# Patient Record
Sex: Male | Born: 1970
Health system: Southern US, Community
[De-identification: ages and names within clinical notes are randomized; demographics above are authoritative.]

## PROBLEM LIST (undated history)

## (undated) DIAGNOSIS — T7840XA Allergy, unspecified, initial encounter: Secondary | ICD-10-CM

## (undated) DIAGNOSIS — C7A8 Other malignant neuroendocrine tumors: Secondary | ICD-10-CM

## (undated) DIAGNOSIS — R0989 Other specified symptoms and signs involving the circulatory and respiratory systems: Secondary | ICD-10-CM

## (undated) DIAGNOSIS — K5520 Angiodysplasia of colon without hemorrhage: Secondary | ICD-10-CM

## (undated) DIAGNOSIS — G4730 Sleep apnea, unspecified: Secondary | ICD-10-CM

## (undated) HISTORY — DX: Sleep apnea, unspecified: G47.30

## (undated) HISTORY — DX: Angiodysplasia of colon without hemorrhage: K55.20

## (undated) HISTORY — DX: Allergy, unspecified, initial encounter: T78.40XA

## (undated) HISTORY — PX: CHOLECYSTECTOMY: SHX55

## (undated) HISTORY — PX: LIVER SURGERY: SHX698

## (undated) HISTORY — PX: WISDOM TOOTH EXTRACTION: SHX21

## (undated) HISTORY — PX: SPLENECTOMY: SUR1306

## (undated) HISTORY — PX: UPPER GASTROINTESTINAL ENDOSCOPY: SHX188

## (undated) HISTORY — PX: PANCREATECTOMY: SHX1019

## (undated) HISTORY — DX: Other specified symptoms and signs involving the circulatory and respiratory systems: R09.89

## (undated) HISTORY — DX: Other malignant neuroendocrine tumors: C7A.8

---

## 2007-03-19 ENCOUNTER — Emergency Department (HOSPITAL_COMMUNITY): Admission: EM | Admit: 2007-03-19 | Discharge: 2007-03-19 | Payer: Self-pay | Admitting: Emergency Medicine

## 2007-11-24 ENCOUNTER — Ambulatory Visit: Payer: Self-pay

## 2007-11-24 ENCOUNTER — Encounter (INDEPENDENT_AMBULATORY_CARE_PROVIDER_SITE_OTHER): Payer: Self-pay | Admitting: Family Medicine

## 2008-02-15 ENCOUNTER — Encounter: Payer: Self-pay | Admitting: Internal Medicine

## 2008-02-18 ENCOUNTER — Encounter: Payer: Self-pay | Admitting: Internal Medicine

## 2008-02-19 ENCOUNTER — Encounter: Payer: Self-pay | Admitting: Internal Medicine

## 2008-02-25 ENCOUNTER — Telehealth: Payer: Self-pay | Admitting: Internal Medicine

## 2008-02-25 ENCOUNTER — Encounter: Payer: Self-pay | Admitting: Internal Medicine

## 2008-02-26 ENCOUNTER — Encounter: Payer: Self-pay | Admitting: Internal Medicine

## 2008-03-02 ENCOUNTER — Ambulatory Visit: Payer: Self-pay | Admitting: Internal Medicine

## 2008-03-04 ENCOUNTER — Ambulatory Visit: Payer: Self-pay | Admitting: Cardiology

## 2008-03-04 ENCOUNTER — Telehealth (INDEPENDENT_AMBULATORY_CARE_PROVIDER_SITE_OTHER): Payer: Self-pay

## 2008-04-01 ENCOUNTER — Telehealth: Payer: Self-pay | Admitting: Internal Medicine

## 2008-04-11 ENCOUNTER — Ambulatory Visit: Payer: Self-pay | Admitting: Internal Medicine

## 2008-04-11 ENCOUNTER — Telehealth: Payer: Self-pay | Admitting: Internal Medicine

## 2008-04-12 ENCOUNTER — Ambulatory Visit: Payer: Self-pay | Admitting: Internal Medicine

## 2008-04-12 LAB — CONVERTED CEMR LAB
ALT: 31 units/L (ref 0–53)
Alkaline Phosphatase: 72 units/L (ref 39–117)
Bilirubin, Direct: 0.1 mg/dL (ref 0.0–0.3)
Eosinophils Relative: 0.2 % (ref 0.0–5.0)
HCT: 45 % (ref 39.0–52.0)
Lipase: 41 units/L (ref 11.0–59.0)
Lymphocytes Relative: 9.9 % — ABNORMAL LOW (ref 12.0–46.0)
Monocytes Absolute: 0.5 10*3/uL (ref 0.1–1.0)
Monocytes Relative: 8 % (ref 3.0–12.0)
Neutrophils Relative %: 81.7 % — ABNORMAL HIGH (ref 43.0–77.0)
Platelets: 106 10*3/uL — ABNORMAL LOW (ref 150–400)
RDW: 12.7 % (ref 11.5–14.6)
Total Bilirubin: 0.8 mg/dL (ref 0.3–1.2)
Total Protein: 7.3 g/dL (ref 6.0–8.3)
WBC: 6.6 10*3/uL (ref 4.5–10.5)

## 2008-06-09 ENCOUNTER — Ambulatory Visit: Payer: Self-pay | Admitting: Internal Medicine

## 2008-07-01 ENCOUNTER — Ambulatory Visit: Payer: Self-pay | Admitting: Internal Medicine

## 2008-07-05 LAB — CONVERTED CEMR LAB
Basophils Relative: 1 % (ref 0.0–3.0)
Eosinophils Absolute: 0.1 10*3/uL (ref 0.0–0.7)
Eosinophils Relative: 1.3 % (ref 0.0–5.0)
Hemoglobin: 15.6 g/dL (ref 13.0–17.0)
Lymphocytes Relative: 33.7 % (ref 12.0–46.0)
MCHC: 33.9 g/dL (ref 30.0–36.0)
MCV: 89.4 fL (ref 78.0–100.0)
Neutro Abs: 2.7 10*3/uL (ref 1.4–7.7)
Neutrophils Relative %: 54.8 % (ref 43.0–77.0)
RBC: 5.14 M/uL (ref 4.22–5.81)
WBC: 5 10*3/uL (ref 4.5–10.5)

## 2008-12-15 IMAGING — CT CT PELVIS W/ CM
1 series · 1 of 1 positions shown · IV contrast (agent unspecified)
Comparison: The outside ultrasound 02/19/2008

CT ABDOMEN

CLINICAL DATA: Epigastric pain, increased lipase

CT ABDOMEN AND PELVIS WITH CONTRAST
TECHNIQUE: Multidetector CT imaging of the abdomen and pelvis was
performed using the standard protocol following bolus
administration of intravenous contrast.
Contrast: 100 ml  Omni 300

[Series 1: — · 0.17mm/px · 1 of 1 slices shown]
[im 1/1  soft-tissue]
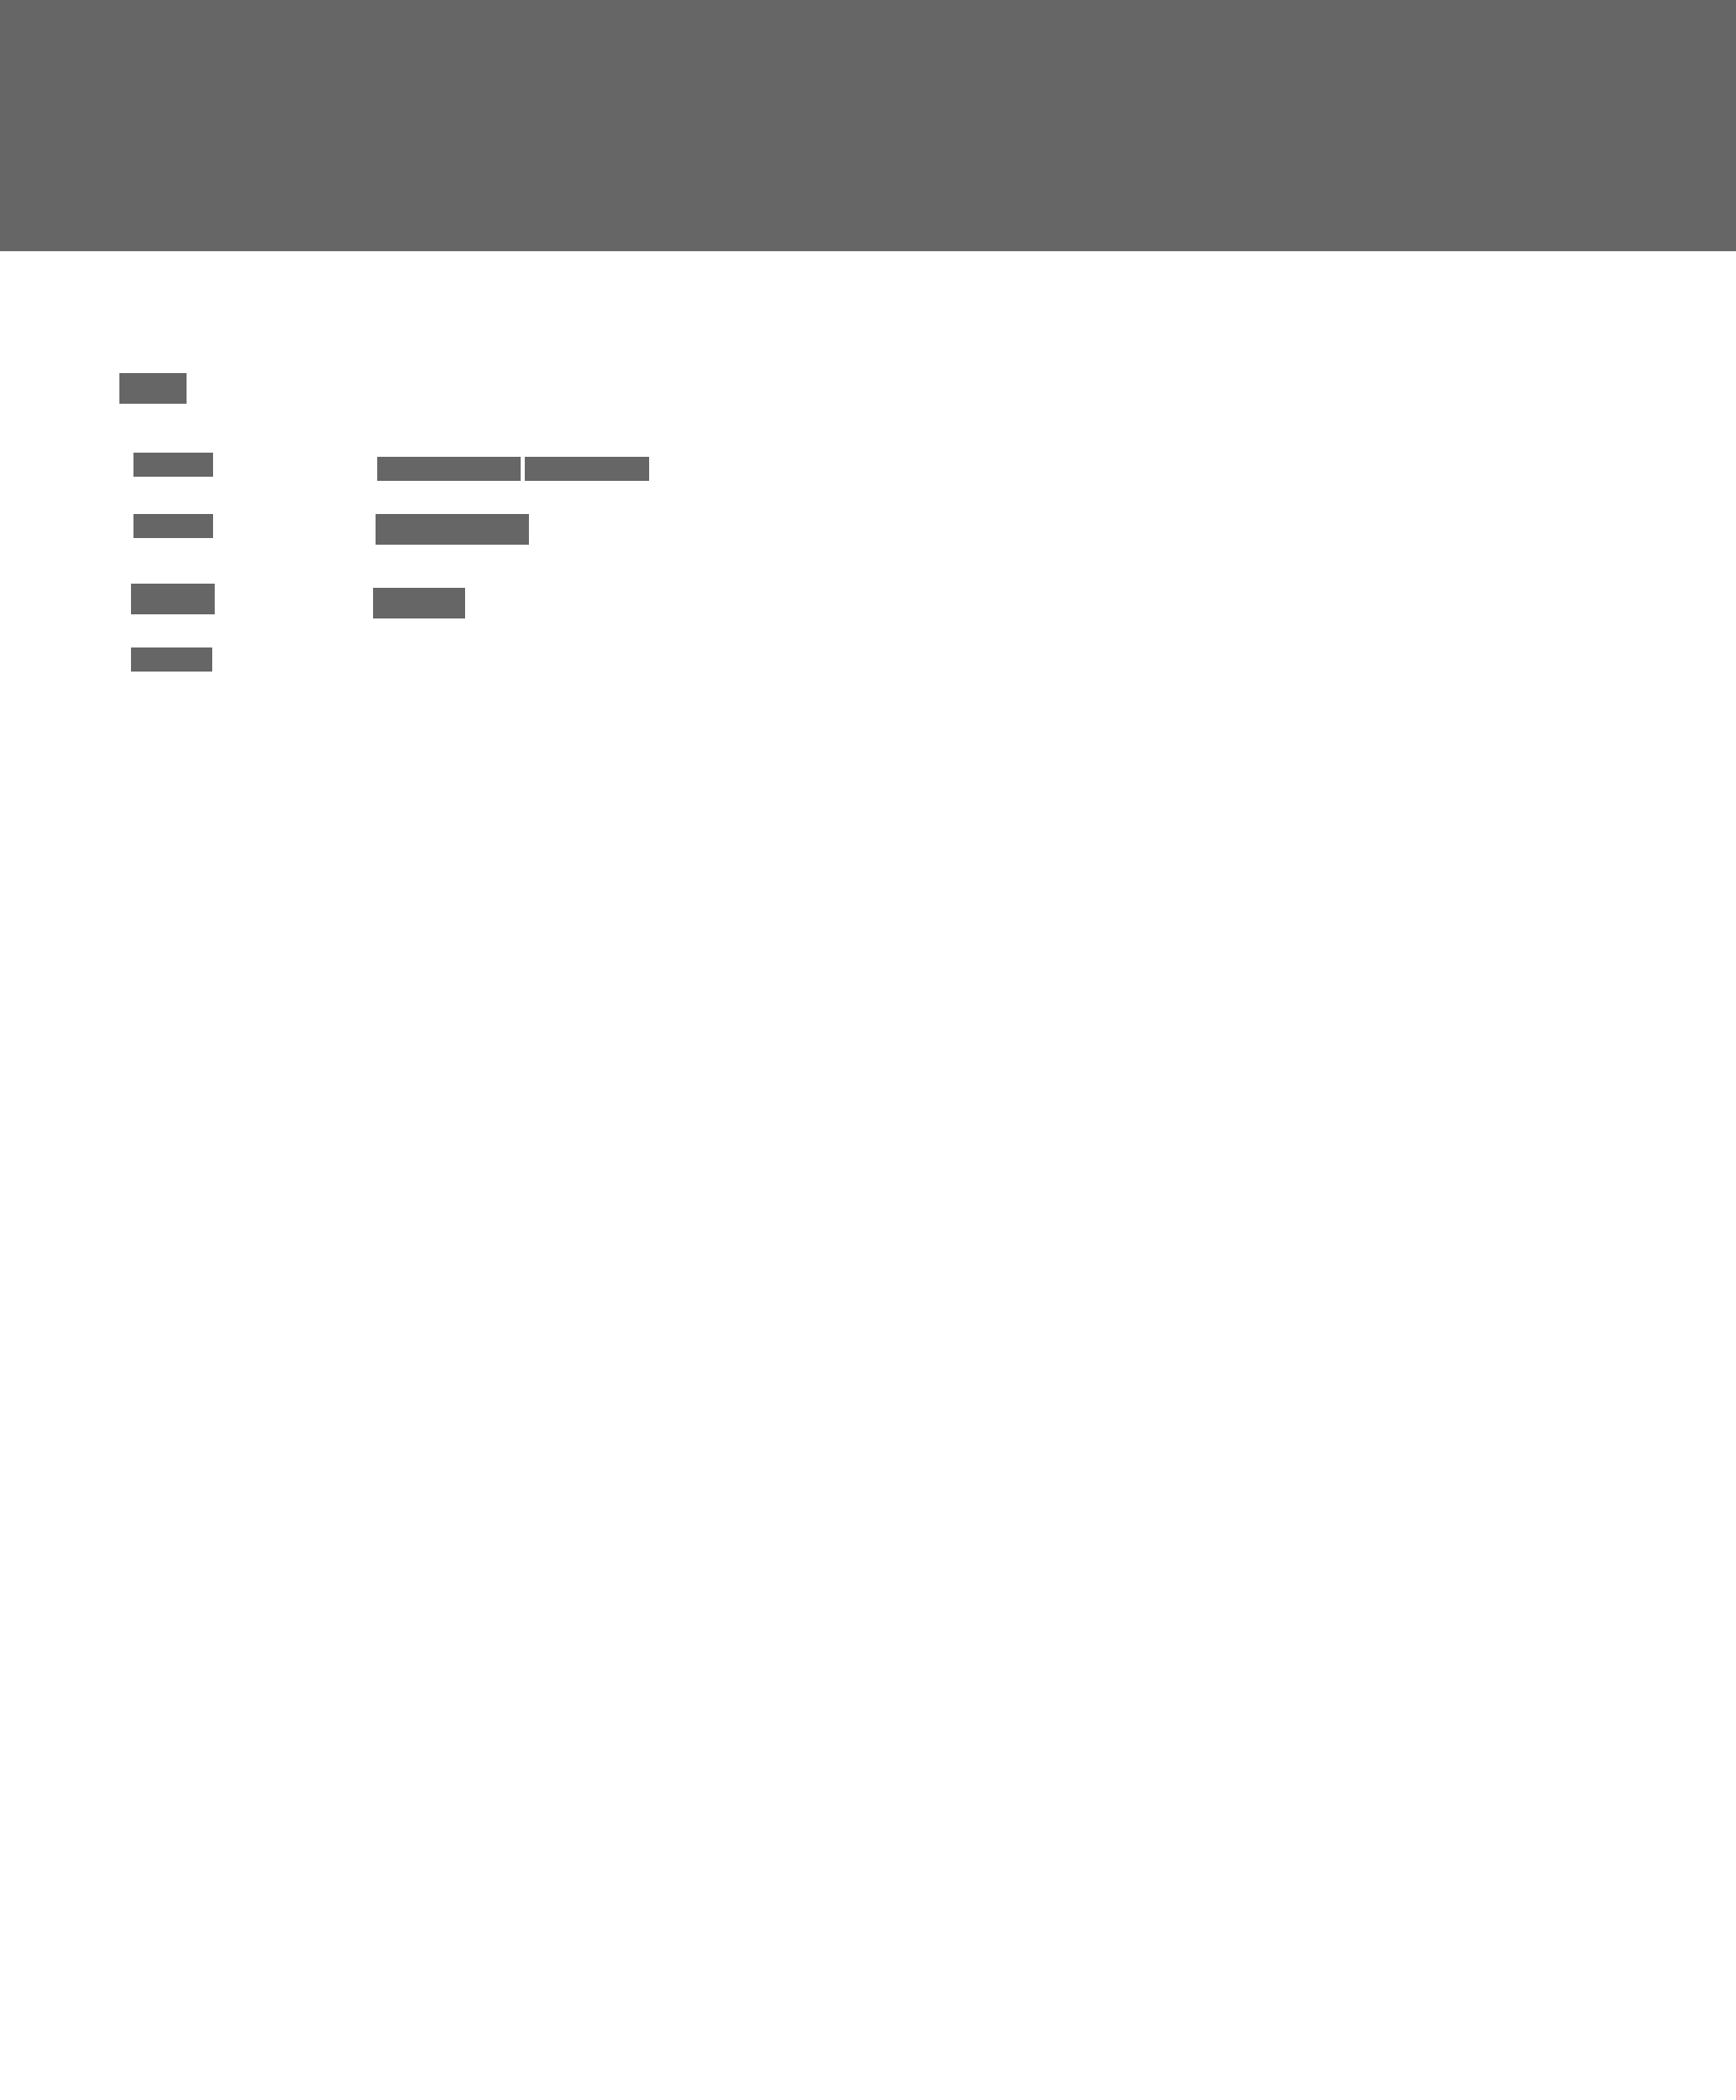

[1 of 1 positions shown; findings below may reference images not displayed]

FINDINGS: Is are clear.

Heart is normal.

In the right hepatic lobe there two low density lesions which
likely correspond to ultrasound abnormalities.  Larger lesion
measures 15 mm on image 10 and the smaller lesion measures 7 mm on
image seven.

The lesion demonstrate peripheral enhancement and fill in on
delayed renal imaging most consistent with benign hemangiomas.  No
additional focal hepatic lesions.  .  Gallbladder appears normal.

The pancreas demonstrates no evidence of focal lesion.  No evidence
of ductal dilatation or atrophy.  Additionally, there is no
evidence of peripancreatic inflammation.

The spleen, adrenal glands, kidneys, all appear normal.

Moderate amount of retained foodstuff in the stomach.  The small
bowel appears normal.  The appendix is not clearly visualized
however there is no peri cecal inflammation.  The ascending colon,
transverse colon, and descending colon appear normal.

The abdominal aorta is normal.  No evidence of abdominal
lymphadenopathy.  There is a prominent gastroepiploic vein which
empties into the confluence of the SMV and portal vein.  (Image
26).  No evidence of venous occlusion.  Portal veins are patent as
well splenic vein.
IMPRESSION: 1. No acute abdominal process.
2..  Hypodensities in the right hepatic lobe have characteristics
most consistent with a benign hemangiomas..

CT PELVIS
FINDINGS: The bladder is normal.  Normal  prostate and  seminal
vesicles.  Several diverticula of the sigmoid colon.  No evidence
of acute inflammation.  No evidence of pelvic lymphadenopathy.
Review of bone windows demonstrates no aggressive osseous lesions.
IMPRESSION: 1..  No acute pelvic process.
2..  Sigmoid diverticula without evidence of acute diverticulitis.

## 2010-05-16 ENCOUNTER — Emergency Department (HOSPITAL_COMMUNITY): Admission: EM | Admit: 2010-05-16 | Discharge: 2010-05-16 | Payer: Self-pay | Admitting: Family Medicine

## 2010-10-28 ENCOUNTER — Encounter: Payer: Self-pay | Admitting: Family Medicine

## 2011-07-25 LAB — POCT URINALYSIS DIP (DEVICE)
Glucose, UA: NEGATIVE
Ketones, ur: 40 — AB
Operator id: 239701
Protein, ur: NEGATIVE
Specific Gravity, Urine: 1.015

## 2011-07-25 LAB — I-STAT 8, (EC8 V) (CONVERTED LAB)
BUN: 11
Chloride: 103
Glucose, Bld: 86
HCT: 47
Hemoglobin: 16
Operator id: 239701
Sodium: 136

## 2011-07-25 LAB — CBC
HCT: 45.7
MCV: 85.8
Platelets: 153
RBC: 5.33
WBC: 7

## 2011-07-25 LAB — DIFFERENTIAL
Eosinophils Relative: 1
Lymphocytes Relative: 19
Lymphs Abs: 1.3
Monocytes Relative: 7
Neutrophils Relative %: 74

## 2011-07-25 LAB — POCT I-STAT CREATININE
Creatinine, Ser: 1
Operator id: 239701

## 2011-09-14 ENCOUNTER — Emergency Department (INDEPENDENT_AMBULATORY_CARE_PROVIDER_SITE_OTHER)
Admission: EM | Admit: 2011-09-14 | Discharge: 2011-09-14 | Disposition: A | Discharge status: Home / Self Care | Payer: Commercial Managed Care - PPO | Source: Home / Self Care

## 2011-09-14 DIAGNOSIS — J069 Acute upper respiratory infection, unspecified: Secondary | ICD-10-CM

## 2011-09-14 NOTE — ED Provider Notes (Signed)
History     CSN: [id number] Arrival date & time: 09/14/2011  9:37 AM   None     Chief Complaint  Patient presents with  . Facial Pain    Pt has cough, sinus pressure and pain since Wed    (Consider location/radiation/quality/duration/timing/severity/associated sxs/prior treatment) Patient is a 40 y.o. male presenting with sinusitis. The history is provided by the patient.  Sinusitis  This is a new problem. The current episode started more than 2 days ago. The problem has been gradually worsening. There has been no fever. The pain is at a severity of 6/10. The pain has been worsening since onset. Associated symptoms include congestion, sinus pressure and cough. Pertinent negatives include no chills, no ear pain, no sore throat, no swollen glands and no shortness of breath. Treatments tried: over the counter cold medicine. The treatment provided no relief.    History reviewed. No pertinent past medical history.  History reviewed. No pertinent past surgical history.  History reviewed. No pertinent family history.  History  Substance Use Topics  . Smoking status: Never Smoker   . Smokeless tobacco: Not on file  . Alcohol Use: Yes     occ      Review of Systems  Constitutional: Negative for fever and chills.  HENT: Positive for congestion, rhinorrhea, postnasal drip and sinus pressure. Negative for ear pain and sore throat.   Respiratory: Positive for cough. Negative for shortness of breath.     Allergies  Review of patient's allergies indicates no known allergies.  Home Medications   Current Outpatient Rx  Name Route Sig Dispense Refill  . ALKA-SELTZER ANTACID PO Oral Take by mouth.        BP 102/71  Pulse 85  Temp(Src) 99.2 F (37.3 C) (Oral)  Resp 17  SpO2 98%  Physical Exam  Constitutional: He appears well-developed and well-nourished. No distress.  HENT:  Right Ear: Tympanic membrane, external ear and ear canal normal.  Left Ear: Tympanic membrane,  external ear and ear canal normal.  Nose: Mucosal edema and rhinorrhea present. Right sinus exhibits maxillary sinus tenderness and frontal sinus tenderness. Left sinus exhibits maxillary sinus tenderness and frontal sinus tenderness.  Mouth/Throat: Oropharynx is clear and moist and mucous membranes are normal.  Cardiovascular: Normal rate and regular rhythm.   Pulmonary/Chest: Effort normal and breath sounds normal.  Lymphadenopathy:       Head (right side): No submandibular adenopathy present.       Head (left side): No submandibular adenopathy present.    He has no cervical adenopathy.    ED Course  Procedures (including critical care time)  Labs Reviewed - No data to display No results found.   1. Upper respiratory infection       MDM          Cathlyn Parsons, NP 09/14/11 [phone]

## 2011-09-14 NOTE — ED Provider Notes (Signed)
Medical screening examination/treatment/procedure(s) were performed by non-physician practitioner and as supervising physician I was immediately available for consultation/collaboration.  Raynald Blend, MD 09/14/11 [phone]

## 2011-11-08 ENCOUNTER — Other Ambulatory Visit (INDEPENDENT_AMBULATORY_CARE_PROVIDER_SITE_OTHER): Payer: Commercial Managed Care - PPO | Admitting: *Deleted

## 2011-11-08 DIAGNOSIS — R0989 Other specified symptoms and signs involving the circulatory and respiratory systems: Secondary | ICD-10-CM

## 2011-11-14 NOTE — Procedures (Unsigned)
CAROTID DUPLEX EXAM  INDICATION:  Right carotid bruit  HISTORY: Diabetes:  No Cardiac:  No Hypertension:  No Smoking:  No Previous Surgery:  No CV History:  Currently asymptomatic Amaurosis Fugax  No, Paresthesias No, Hemiparesis No                                       RIGHT             LEFT Brachial systolic pressure:         117               120 Brachial Doppler waveforms:         Normal            Normal Vertebral direction of flow:        Antegrade         Antegrade DUPLEX VELOCITIES (cm/sec) CCA peak systolic                   120               136 ECA peak systolic                   84                99 ICA peak systolic                   67                63 ICA end diastolic                   30                31 PLAQUE MORPHOLOGY: PLAQUE AMOUNT:                      None              None PLAQUE LOCATION:  IMPRESSION:  No hemodynamically significant stenosis of the right or left ICA.  No evidence of stenosis in the right CCA or right subclavian arteries.  Antegrade vertebral arteries bilaterally.  ___________________________________________ Larina Earthly, M.D.  EM/MEDQ  D:  11/08/2011  T:  11/08/2011  Job:  [id number]

## 2012-10-07 DIAGNOSIS — C7A8 Other malignant neuroendocrine tumors: Secondary | ICD-10-CM

## 2012-10-07 HISTORY — DX: Other malignant neuroendocrine tumors: C7A.8

## 2012-12-15 ENCOUNTER — Ambulatory Visit
Admission: RE | Admit: 2012-12-15 | Discharge: 2012-12-15 | Disposition: A | Discharge status: Home / Self Care | Payer: Commercial Managed Care - PPO | Source: Ambulatory Visit | Attending: Internal Medicine | Admitting: Internal Medicine

## 2012-12-15 ENCOUNTER — Other Ambulatory Visit: Payer: Self-pay | Admitting: Internal Medicine

## 2012-12-15 DIAGNOSIS — R634 Abnormal weight loss: Secondary | ICD-10-CM

## 2012-12-15 MED ORDER — IOHEXOL 300 MG/ML  SOLN
100.0000 mL | Freq: Once | INTRAMUSCULAR | Status: AC | PRN
  Administered 2012-12-15: 100 mL via INTRAVENOUS

## 2012-12-16 ENCOUNTER — Ambulatory Visit (HOSPITAL_COMMUNITY)
Admission: RE | Admit: 2012-12-16 | Discharge: 2012-12-16 | Disposition: A | Discharge status: Home / Self Care | Payer: Commercial Managed Care - PPO | Source: Ambulatory Visit

## 2012-12-16 ENCOUNTER — Other Ambulatory Visit: Payer: Self-pay | Admitting: Radiology

## 2012-12-16 ENCOUNTER — Other Ambulatory Visit (HOSPITAL_COMMUNITY): Payer: Self-pay | Admitting: Internal Medicine

## 2012-12-16 ENCOUNTER — Encounter (HOSPITAL_COMMUNITY): Payer: Self-pay

## 2012-12-16 ENCOUNTER — Ambulatory Visit (HOSPITAL_COMMUNITY)
Admission: RE | Admit: 2012-12-16 | Discharge: 2012-12-16 | Disposition: A | Discharge status: Home / Self Care | Payer: Commercial Managed Care - PPO | Source: Ambulatory Visit | Attending: Internal Medicine | Admitting: Internal Medicine

## 2012-12-16 DIAGNOSIS — C787 Secondary malignant neoplasm of liver and intrahepatic bile duct: Secondary | ICD-10-CM | POA: Insufficient documentation

## 2012-12-16 DIAGNOSIS — K869 Disease of pancreas, unspecified: Secondary | ICD-10-CM | POA: Insufficient documentation

## 2012-12-16 DIAGNOSIS — C7A00 Malignant carcinoid tumor of unspecified site: Secondary | ICD-10-CM | POA: Insufficient documentation

## 2012-12-16 LAB — APTT: aPTT: 29 seconds (ref 24–37)

## 2012-12-16 LAB — CBC
HCT: 44.9 % (ref 39.0–52.0)
Hemoglobin: 15 g/dL (ref 13.0–17.0)
MCV: 86.7 fL (ref 78.0–100.0)

## 2012-12-16 MED ORDER — FENTANYL CITRATE 0.05 MG/ML IJ SOLN
INTRAMUSCULAR | Status: AC
  Filled 2012-12-16: qty 6

## 2012-12-16 MED ORDER — MIDAZOLAM HCL 2 MG/2ML IJ SOLN
INTRAMUSCULAR | Status: AC
  Filled 2012-12-16: qty 6

## 2012-12-16 MED ORDER — SODIUM CHLORIDE 0.9 % IV SOLN: INTRAVENOUS | Status: DC

## 2012-12-16 MED ORDER — MIDAZOLAM HCL 2 MG/2ML IJ SOLN
INTRAMUSCULAR | Status: AC | PRN
  Administered 2012-12-16 (×2): 0.5 mg via INTRAVENOUS
  Administered 2012-12-16: 1 mg via INTRAVENOUS
  Administered 2012-12-16: 2 mg via INTRAVENOUS

## 2012-12-16 MED ORDER — HYDROCODONE-ACETAMINOPHEN 5-325 MG PO TABS: 1.0000 | ORAL_TABLET | ORAL | Status: DC | PRN

## 2012-12-16 MED ORDER — FENTANYL CITRATE 0.05 MG/ML IJ SOLN
INTRAMUSCULAR | Status: AC | PRN
  Administered 2012-12-16: 25 ug via INTRAVENOUS
  Administered 2012-12-16: 50 ug via INTRAVENOUS
  Administered 2012-12-16: 100 ug via INTRAVENOUS
  Administered 2012-12-16: 25 ug via INTRAVENOUS

## 2012-12-16 NOTE — H&P (Signed)
Chief Complaint: "I'm here for a biopsy" Referring Physician:Shaw HPI: Kenneth Bryant is an 42 y.o. healthy male who had some abnormal labs including elevated calcium and weight loss on his recent CPE. His workup has now led to CT imaging studies which finds a large pancreatic mass and multiple liver lesions concerning for metastatic process. He is now referred to IR for biopsy of liver lesion to obtain tissue diagnosis. PMHx and meds reviewed.  Past Medical History: History reviewed. No pertinent past medical history.  Past Surgical History: History reviewed. No pertinent past surgical history.  Family History: History reviewed. No pertinent family history.  Social History:  reports that he has never smoked. He does not have any smokeless tobacco history on file. He reports that he drinks about 1.8 ounces of alcohol per week. He reports that he uses illicit drugs.  Allergies: No Known Allergies  Medications: None   Please HPI for pertinent positives, otherwise complete 10 system ROS negative.  Physical Exam: Blood pressure 115/61, pulse 110, temperature 98.2 F (36.8 C), temperature source Oral, resp. rate 18, height 5\' 5"  (1.651 m), weight 123 lb (55.792 kg), SpO2 100.00%. Body mass index is 20.47 kg/(m^2).   General Appearance:  Alert, cooperative, appears anxious  Head:  Normocephalic, without obvious abnormality, atraumatic  ENT: Unremarkable  Neck: Supple, symmetrical, trachea midline, no adenopathy, thyroid: not enlarged, symmetric, no tenderness/mass/nodules  Lungs:   Clear to auscultation bilaterally, no w/r/r, respirations unlabored without use of accessory muscles.  Heart:  Regular rate and rhythm, S1, S2 normal, no murmur, rub or gallop. Carotids 2+ without bruit.  Abdomen:   Soft, non-tender, non distended. Bowel sounds active all four quadrants,  no masses, no organomegaly.  Neurologic: Normal affect, no gross deficits.   Results for orders placed during the hospital  encounter of 12/16/12 (from the past 48 hour(s))  APTT     Status: None   Collection Time    12/16/12 12:50 PM      Result Value Range   aPTT 29  24 - 37 seconds  CBC     Status: None   Collection Time    12/16/12 12:50 PM      Result Value Range   WBC 8.0  4.0 - 10.5 K/uL   RBC 5.18  4.22 - 5.81 MIL/uL   Hemoglobin 15.0  13.0 - 17.0 g/dL   HCT 40.9  81.1 - 91.4 %   MCV 86.7  78.0 - 100.0 fL   MCH 29.0  26.0 - 34.0 pg   MCHC 33.4  30.0 - 36.0 g/dL   RDW 78.2  95.6 - 21.3 %   Platelets 174  150 - 400 K/uL  PROTIME-INR     Status: None   Collection Time    12/16/12 12:50 PM      Result Value Range   Prothrombin Time 13.1  11.6 - 15.2 seconds   INR 1.00  0.00 - 1.49   Ct Soft Tissue Neck W Contrast  12/15/2012  *RADIOLOGY REPORT*  Clinical Data: Unexplained weight loss.  Hypercalcemia.  Question lymphoma?  CT of the chest, abdomen and pelvis dictated separately.  CT NECK WITH CONTRAST  Technique:  Multidetector CT imaging of the neck was performed with intravenous contrast.  Contrast: OMNIPAQUE IOHEXOL 300 MG/ML  SOLN  Comparison: None.  Findings: Visualized lung apices clear.  No worrisome primary neck mass.  Mild nodularity posterior aspect of the thyroid gland bilaterally. No definitive findings of parathyroid adenoma.  If this were  of high clinical concern, correlation with nuclear medicine exam may be considered  No adenopathy.  No bony destructive lesion.  Minimal polypoid opacification left maxillary sinus.  Minimal cervical spondylotic changes C5-6.  IMPRESSION: No adenopathy or primary neck mass identified.  Mild nodularity posterior aspect of the thyroid gland bilaterally. No definitive findings of parathyroid adenoma.  If this were of high clinical concern, correlation with nuclear medicine exam may be considered.  This is a call report.   Original Report Authenticated By: Lacy Duverney, M.D.    Ct Chest W Contrast  12/15/2012  *RADIOLOGY REPORT*  Clinical Data:   Hypercalcemia, unexplained weight loss  CT CHEST, ABDOMEN AND PELVIS WITH CONTRAST  Technique:  Multidetector CT imaging of the chest, abdomen and pelvis was performed following the standard protocol during bolus administration of intravenous contrast.  Contrast: OMNIPAQUE IOHEXOL 300 MG/ML  SOLN  Comparison:  03/04/2008  CT CHEST  Findings:  2 mm left upper lobe pleural parenchymal nodule image 16, unlikely to be significant.  Lungs are otherwise clear.  Heart size is normal.  Great vessels are normal in caliber.  No lymphadenopathy.  No pleural or pericardial effusion.  IMPRESSION: No acute cardiopulmonary process or evidence for intrathoracic metastatic disease.  Please see CT abdomen/pelvis report below.  CT ABDOMEN AND PELVIS  Findings:  There are peripheral rim enhancing hepatic masses throughout the liver, largest lateral segment left hepatic lobe measuring 4.9 cm image 56.  There is an ill-defined hypodense lobular mass in the tail of the pancreas with lack of clear fat plane of the adjacent splenic hilum, measuring 6.1 x 3.5 cm.  No pancreatic ductal dilatation is identified.  A few adjacent collateral vessels are identified.  Gallbladder, kidneys, adrenal glands are unremarkable.  No retroperitoneal lymphadenopathy.  Bladder is normal.  No bowel wall thickening or focal segmental dilatation.  The appendix is not identified to there is no secondary evidence for acute appendicitis.  No acute osseous finding or lytic/sclerotic osseous lesion.  IMPRESSION: Pancreatic tail mass with hepatic masses, likely metastases. Primary differential considerations include pancreatic adenocarcinoma and neuroendocrine tumor given the history of hypercalcemia.  These results were called by telephone on 12/15/2012 at 4:50 p.m. to Dr. Clelia Croft, who verbally acknowledged these results.   Original Report Authenticated By: Christiana Pellant, M.D.    Ct Abdomen Pelvis W Contrast  12/15/2012  *RADIOLOGY REPORT*  Clinical Data:   Hypercalcemia, unexplained weight loss  CT CHEST, ABDOMEN AND PELVIS WITH CONTRAST  Technique:  Multidetector CT imaging of the chest, abdomen and pelvis was performed following the standard protocol during bolus administration of intravenous contrast.  Contrast: OMNIPAQUE IOHEXOL 300 MG/ML  SOLN  Comparison:  03/04/2008  CT CHEST  Findings:  2 mm left upper lobe pleural parenchymal nodule image 16, unlikely to be significant.  Lungs are otherwise clear.  Heart size is normal.  Great vessels are normal in caliber.  No lymphadenopathy.  No pleural or pericardial effusion.  IMPRESSION: No acute cardiopulmonary process or evidence for intrathoracic metastatic disease.  Please see CT abdomen/pelvis report below.  CT ABDOMEN AND PELVIS  Findings:  There are peripheral rim enhancing hepatic masses throughout the liver, largest lateral segment left hepatic lobe measuring 4.9 cm image 56.  There is an ill-defined hypodense lobular mass in the tail of the pancreas with lack of clear fat plane of the adjacent splenic hilum, measuring 6.1 x 3.5 cm.  No pancreatic ductal dilatation is identified.  A few adjacent collateral vessels are  identified.  Gallbladder, kidneys, adrenal glands are unremarkable.  No retroperitoneal lymphadenopathy.  Bladder is normal.  No bowel wall thickening or focal segmental dilatation.  The appendix is not identified to there is no secondary evidence for acute appendicitis.  No acute osseous finding or lytic/sclerotic osseous lesion.  IMPRESSION: Pancreatic tail mass with hepatic masses, likely metastases. Primary differential considerations include pancreatic adenocarcinoma and neuroendocrine tumor given the history of hypercalcemia.  These results were called by telephone on 12/15/2012 at 4:50 p.m. to Dr. Clelia Croft, who verbally acknowledged these results.   Original Report Authenticated By: Christiana Pellant, M.D.     Assessment/Plan Pancreatic mass with multiple liver lesions. Discussed US  guided liver lesion biopsy with pt and wife. Procedure risks, complications, and use of sedation explained. Labs reviewed. Consent signed in chart  Brayton El PA-C 12/16/2012, 1:22 PM

## 2012-12-16 NOTE — Procedures (Signed)
US guided FNA and core biopsies of the left hepatic lobe lesion.  2 FNAs and 3 cores obtained.  No immediate complication.

## 2012-12-23 ENCOUNTER — Other Ambulatory Visit: Payer: Self-pay | Admitting: Internal Medicine

## 2012-12-23 DIAGNOSIS — C259 Malignant neoplasm of pancreas, unspecified: Secondary | ICD-10-CM

## 2012-12-24 ENCOUNTER — Other Ambulatory Visit: Payer: Commercial Managed Care - PPO

## 2014-07-07 ENCOUNTER — Encounter: Payer: Self-pay | Admitting: Internal Medicine

## 2016-03-12 DIAGNOSIS — Z08 Encounter for follow-up examination after completed treatment for malignant neoplasm: Secondary | ICD-10-CM | POA: Diagnosis not present

## 2016-03-12 DIAGNOSIS — Z8589 Personal history of malignant neoplasm of other organs and systems: Secondary | ICD-10-CM | POA: Diagnosis not present

## 2016-03-12 DIAGNOSIS — R911 Solitary pulmonary nodule: Secondary | ICD-10-CM | POA: Diagnosis not present

## 2016-03-12 DIAGNOSIS — C7A8 Other malignant neuroendocrine tumors: Secondary | ICD-10-CM | POA: Diagnosis not present

## 2016-03-27 DIAGNOSIS — H5213 Myopia, bilateral: Secondary | ICD-10-CM | POA: Diagnosis not present

## 2016-12-16 DIAGNOSIS — R911 Solitary pulmonary nodule: Secondary | ICD-10-CM | POA: Diagnosis not present

## 2016-12-16 DIAGNOSIS — C7A8 Other malignant neuroendocrine tumors: Secondary | ICD-10-CM | POA: Diagnosis not present

## 2016-12-16 DIAGNOSIS — D3A8 Other benign neuroendocrine tumors: Secondary | ICD-10-CM | POA: Diagnosis not present

## 2017-01-30 DIAGNOSIS — Z0000 Encounter for general adult medical examination without abnormal findings: Secondary | ICD-10-CM | POA: Diagnosis not present

## 2017-01-30 DIAGNOSIS — Z125 Encounter for screening for malignant neoplasm of prostate: Secondary | ICD-10-CM | POA: Diagnosis not present

## 2017-02-06 DIAGNOSIS — Z9081 Acquired absence of spleen: Secondary | ICD-10-CM | POA: Diagnosis not present

## 2017-02-06 DIAGNOSIS — Z0000 Encounter for general adult medical examination without abnormal findings: Secondary | ICD-10-CM | POA: Diagnosis not present

## 2017-02-06 DIAGNOSIS — Z1389 Encounter for screening for other disorder: Secondary | ICD-10-CM | POA: Diagnosis not present

## 2017-02-06 DIAGNOSIS — Z125 Encounter for screening for malignant neoplasm of prostate: Secondary | ICD-10-CM | POA: Diagnosis not present

## 2017-02-06 DIAGNOSIS — R0683 Snoring: Secondary | ICD-10-CM | POA: Diagnosis not present

## 2017-02-06 DIAGNOSIS — C7A098 Malignant carcinoid tumors of other sites: Secondary | ICD-10-CM | POA: Diagnosis not present

## 2017-03-11 DIAGNOSIS — J343 Hypertrophy of nasal turbinates: Secondary | ICD-10-CM | POA: Diagnosis not present

## 2017-03-11 DIAGNOSIS — J342 Deviated nasal septum: Secondary | ICD-10-CM | POA: Diagnosis not present

## 2017-03-11 DIAGNOSIS — G4733 Obstructive sleep apnea (adult) (pediatric): Secondary | ICD-10-CM | POA: Diagnosis not present

## 2017-12-15 DIAGNOSIS — R911 Solitary pulmonary nodule: Secondary | ICD-10-CM | POA: Diagnosis not present

## 2017-12-15 DIAGNOSIS — D3A8 Other benign neuroendocrine tumors: Secondary | ICD-10-CM | POA: Diagnosis not present

## 2017-12-29 DIAGNOSIS — H1045 Other chronic allergic conjunctivitis: Secondary | ICD-10-CM | POA: Diagnosis not present

## 2018-02-10 DIAGNOSIS — Z125 Encounter for screening for malignant neoplasm of prostate: Secondary | ICD-10-CM | POA: Diagnosis not present

## 2018-02-10 DIAGNOSIS — Z0000 Encounter for general adult medical examination without abnormal findings: Secondary | ICD-10-CM | POA: Diagnosis not present

## 2018-02-17 DIAGNOSIS — R0683 Snoring: Secondary | ICD-10-CM | POA: Diagnosis not present

## 2018-02-17 DIAGNOSIS — N529 Male erectile dysfunction, unspecified: Secondary | ICD-10-CM | POA: Diagnosis not present

## 2018-02-17 DIAGNOSIS — R351 Nocturia: Secondary | ICD-10-CM | POA: Diagnosis not present

## 2018-02-17 DIAGNOSIS — Z0000 Encounter for general adult medical examination without abnormal findings: Secondary | ICD-10-CM | POA: Diagnosis not present

## 2018-02-17 DIAGNOSIS — C7A098 Malignant carcinoid tumors of other sites: Secondary | ICD-10-CM | POA: Diagnosis not present

## 2018-02-17 DIAGNOSIS — Z1389 Encounter for screening for other disorder: Secondary | ICD-10-CM | POA: Diagnosis not present

## 2018-02-18 DIAGNOSIS — Z1212 Encounter for screening for malignant neoplasm of rectum: Secondary | ICD-10-CM | POA: Diagnosis not present

## 2018-04-14 ENCOUNTER — Ambulatory Visit: Payer: BLUE CROSS/BLUE SHIELD | Admitting: Neurology

## 2018-04-14 ENCOUNTER — Encounter: Payer: Self-pay | Admitting: Neurology

## 2018-04-14 VITALS — BP 124/78 | HR 73 | Ht 65.0 in | Wt 147.0 lb

## 2018-04-14 DIAGNOSIS — G2581 Restless legs syndrome: Secondary | ICD-10-CM | POA: Diagnosis not present

## 2018-04-14 DIAGNOSIS — G4719 Other hypersomnia: Secondary | ICD-10-CM

## 2018-04-14 DIAGNOSIS — R0683 Snoring: Secondary | ICD-10-CM

## 2018-04-14 DIAGNOSIS — R351 Nocturia: Secondary | ICD-10-CM

## 2018-04-14 DIAGNOSIS — R0981 Nasal congestion: Secondary | ICD-10-CM

## 2018-04-14 NOTE — Progress Notes (Signed)
Subjective:    Patient ID: Ansh Fauble is a 47 y.o. male.  HPI     Star Age, MD, PhD [facility] [address], [address] [place], [place] [address]  Dear Dr. Brigitte Pulse,   I saw your patient, Carol Loftin, upon your kind request in my neurologic clinic today for initial consultation of his sleep disturbance, in particular, concern for underlying obstructive sleep apnea. The patient is unaccompanied today. As you know, Mr. Antigua is a 47 year old right-handed gentleman with an underlying medical history of pancreatic tumor, followed by [facility] oncology, and reflux disease, who reports a longer standing history of loud snoring. Snoring is bothersome to his wife, he is not sure about any breathing difficulty at night but does wake up with dry mouth and has evidence of mouth breathing. For his snoring he consulted with ENT last year in June and saw Dr. Janace Hoard who talked to him about potential septoplasty and inferior turbinate reduction. He has tried over-the-counter nasal strips and head splint for snoring, to no avail. He is not aware of any family history of OSA. He does have nasal congestion at night but denies any allergy symptoms during the day or postnasal drip. He denies morning headaches but does have nocturia about once or twice per average night and the occasional restless leg symptoms specially if he plays tennis later in the afternoon or evening. His bedtime is generally between 10:30 and 11 and rise time around 6 or 6:30. He lives with his wife, has 3 children, oldest one in college, the other ones at home, ages 17 and 10, he does have a TV in the bedroom but turned off prior to falling asleep. They have no pets. He is a nonsmoker and consumes alcohol in the form of wine, 2-3 times a week, caffeine in the form of coffee, 2 cups per day on average in the mornings, he is an Forensic psychologist. Weight has been stable. His Epworth sleepiness score is 9 out of 24 which is  borderline, fatigue score is 25 out of 63.  His Past Medical History Is Significant For: Past Medical History:  Diagnosis Date  . Carotid bruit   . Primary malignant neuroendocrine tumor of pancreas [facility])     His Past Surgical History Is Significant For: Past Surgical History:  Procedure Laterality Date  . SPLENECTOMY      His Family History Is Significant For: Family History  Problem Relation Age of Onset  . Benign prostatic hyperplasia Father     His Social History Is Significant For: Social History   Socioeconomic History  . Marital status: Married    Spouse name: Not on file  . Number of children: Not on file  . Years of education: Not on file  . Highest education level: Not on file  Occupational History  . Not on file  Social Needs  . Financial resource strain: Not on file  . Food insecurity:    Worry: Not on file    Inability: Not on file  . Transportation needs:    Medical: Not on file    Non-medical: Not on file  Tobacco Use  . Smoking status: Never Smoker  . Smokeless tobacco: Never Used  Substance and Sexual Activity  . Alcohol use: Yes    Alcohol/week: 1.8 oz    Types: 1 Glasses of wine, 1 Cans of beer, 1 Shots of liquor per week    Comment: occ  . Drug use: Yes  . Sexual activity:  Yes  Lifestyle  . Physical activity:    Days per week: Not on file    Minutes per session: Not on file  . Stress: Not on file  Relationships  . Social connections:    Talks on phone: Not on file    Gets together: Not on file    Attends religious service: Not on file    Active member of club or organization: Not on file    Attends meetings of clubs or organizations: Not on file    Relationship status: Not on file  Other Topics Concern  . Not on file  Social History Narrative  . Not on file    His Allergies Are:  No Known Allergies:   His Current Medications Are:  Outpatient Encounter Medications as of 04/14/2018  Medication Sig  . Calcium Carbonate Antacid  (ALKA-SELTZER ANTACID PO) Take by mouth.    . sildenafil (REVATIO) 20 MG tablet Take 20 mg by mouth.  . [DISCONTINUED] tamsulosin (FLOMAX) 0.4 MG CAPS capsule Take 0.4 mg by mouth daily.   No facility-administered encounter medications on file as of 04/14/2018.   :  Review of Systems:  Out of a complete 14 point review of systems, all are reviewed and negative with the exception of these symptoms as listed below: Review of Systems  Neurological:       Pt presents today to discuss his sleep. Pt has never had a sleep study study but does endorse snoring.  Epworth Sleepiness Scale 0= would never doze 1= slight chance of dozing 2= moderate chance of dozing 3= high chance of dozing  Sitting and reading: 2 Watching TV: 2 Sitting inactive in a public place (ex. Theater or meeting): 1 As a passenger in a car for an hour without a break: 1 Lying down to rest in the afternoon: 3 Sitting and talking to someone: 0 Sitting quietly after lunch (no alcohol): 0 In a car, while stopped in traffic: 0 Total: 9     Objective:  Neurological Exam  Physical Exam Physical Examination:   Vitals:   04/14/18 1517  BP: 124/78  Pulse: 73    General Examination: The patient is a very pleasant 47 y.o. male in no acute distress. He appears well-developed and well-nourished and well groomed.   HEENT: Normocephalic, atraumatic, pupils are equal, round and reactive to light and accommodation. Corrective eyeglasses in place. Extraocular tracking is good without limitation to gaze excursion or nystagmus noted. Normal smooth pursuit is noted. Hearing is grossly intact. Face is symmetric with normal facial animation and normal facial sensation. Speech is clear with no dysarthria noted. There is no hypophonia. There is no lip, neck/head, jaw or voice tremor. Neck is supple with full range of passive and active motion. Oropharynx exam reveals: mild mouth dryness, adequate dental hygiene and moderate airway  crowding, due to smaller airway entry, larger uvula and tonsils of 1-2+. Mallampati is class II. Neck circumference is 14 inches, he has a mild to moderate overbite. Nasal inspection reveals no significant septal deviation and no significant mucosal bogginess or inferior turbinate hypertrophy to my inspection.  Chest: Clear to auscultation without wheezing, rhonchi or crackles noted.  Heart: S1+S2+0, regular and normal without murmurs, rubs or gallops noted.   Abdomen: Soft, non-tender and non-distended with normal bowel sounds appreciated on auscultation.  Extremities: There is no pitting edema in the distal lower extremities bilaterally. Pedal pulses are intact.  Skin: Warm and dry without trophic changes noted.  Musculoskeletal: exam reveals no  obvious joint deformities, tenderness or joint swelling or erythema.   Neurologically:  Mental status: The patient is awake, alert and oriented in all 4 spheres. His immediate and remote memory, attention, language skills and fund of knowledge are appropriate. There is no evidence of aphasia, agnosia, apraxia or anomia. Speech is clear with normal prosody and enunciation. Thought process is linear. Mood is normal and affect is normal.  Cranial nerves II - XII are as described above under HEENT exam. In addition: shoulder shrug is normal with equal shoulder height noted. Motor exam: Normal bulk, strength and tone is noted. There is no drift, tremor or rebound. Romberg is negative. Fine motor skills and coordination: grossly intact.  Cerebellar testing: No dysmetria or intention tremor. There is no truncal or gait ataxia.  Sensory exam: intact to light touch.  Gait, station and balance: He stands easily. No veering to one side is noted. No leaning to one side is noted. Posture is age-appropriate and stance is narrow based. Gait shows normal stride length and normal pace. No problems turning are noted. Tandem walk is unremarkable.   Assessment and Plan:   In summary, Laterrance Nauta is a very pleasant 47 y.o.-year old male with an underlying medical history of pancreatic tumor ([facility] oncology), and reflux disease, who presents for evaluation of his snoring. While he does not give a telltale history of obstructive sleep apnea, he does have a airway that is on the crowded side. Ruling out obstructive sleep apnea is indicated. He does report occasional daytime symptoms and his Epworth sleepiness score is borderline. He also reports occasional restless leg symptoms.  I had a long chat with the patient about my findings and the diagnosis of OSA, its prognosis and treatment options. We talked about medical treatments, surgical interventions and non-pharmacological approaches. I explained in particular the risks and ramifications of untreated moderate to severe OSA, especially with respect to developing cardiovascular disease down the Road, including congestive heart failure, difficult to treat hypertension, cardiac arrhythmias, or stroke. Even type 2 diabetes has, in part, been linked to untreated OSA. Symptoms of untreated OSA include daytime sleepiness, memory problems, mood irritability and mood disorder such as depression and anxiety, lack of energy, as well as recurrent headaches, especially morning headaches. We talked about trying to maintain a healthy lifestyle in general, as well as the importance of weight control. I encouraged the patient to eat healthy, exercise daily and keep well hydrated, to keep a scheduled bedtime and wake time routine, to not skip any meals and eat healthy snacks in between meals. I advised the patient not to drive when feeling sleepy. I recommended the following at this time: sleep study with potential positive airway pressure titration. (We will score hypopneas at 3%).   I explained the sleep test procedure to the patient and also outlined possible surgical and non-surgical treatment options of OSA, including the use of a  custom-made dental device (which would require a referral to a specialist dentist or oral surgeon), upper airway surgical options, such as pillar implants, radiofrequency surgery, tongue base surgery, and UPPP (which would involve a referral to an ENT surgeon). Rarely, jaw surgery such as mandibular advancement may be considered.  I also explained the CPAP treatment option to the patient, who indicated that he would be willing to try CPAP if the need arises. I explained the importance of being compliant with PAP treatment, not only for insurance purposes but primarily to improve His symptoms, and for the patient's long term  health benefit, including to reduce His cardiovascular risks. I answered all his questions today and the patient was in agreement. I would like to see him back after the sleep study is completed and encouraged him to call with any interim questions, concerns, problems or updates.   Thank you very much for allowing me to participate in the care of this nice patient. If I can be of any further assistance to you please do not hesitate to call me at [phone].  Sincerely,   Star Age, MD, PhD

## 2018-04-14 NOTE — Patient Instructions (Signed)

## 2018-05-05 ENCOUNTER — Ambulatory Visit (INDEPENDENT_AMBULATORY_CARE_PROVIDER_SITE_OTHER): Payer: BLUE CROSS/BLUE SHIELD | Admitting: Neurology

## 2018-05-05 DIAGNOSIS — G4733 Obstructive sleep apnea (adult) (pediatric): Secondary | ICD-10-CM

## 2018-05-05 DIAGNOSIS — R0683 Snoring: Secondary | ICD-10-CM

## 2018-05-05 DIAGNOSIS — G2581 Restless legs syndrome: Secondary | ICD-10-CM

## 2018-05-05 DIAGNOSIS — R0981 Nasal congestion: Secondary | ICD-10-CM

## 2018-05-05 DIAGNOSIS — R351 Nocturia: Secondary | ICD-10-CM

## 2018-05-05 DIAGNOSIS — G4719 Other hypersomnia: Secondary | ICD-10-CM

## 2018-05-05 DIAGNOSIS — G4720 Circadian rhythm sleep disorder, unspecified type: Secondary | ICD-10-CM

## 2018-05-06 ENCOUNTER — Telehealth: Payer: Self-pay

## 2018-05-06 NOTE — Addendum Note (Signed)
Addended by: Star Age on: 05/06/2018 08:41 AM   Modules accepted: Orders

## 2018-05-06 NOTE — Telephone Encounter (Signed)
-----   Message from Star Age, MD sent at 05/06/2018  8:40 AM EDT ----- Patient referred by Dr. Brigitte Pulse, seen by me on 04/14/18, diagnostic PSG on 05/05/18.   Please call and notify the patient that the recent sleep study showed mild obstructive sleep apnea, moderate in supine and REM sleep with a total AHI of 13.5/hour, REM AHI of 18.6/hour, supine AHI of 20.4/hour, and O2 nadir of 85%. Given the patient's medical history and sleep related complaints, treatment with positive airway pressure is recommended. A full-night CPAP titration study is recommended to optimize therapy, ie for proper titration and mask fitting and correct monitoring of the oxygen saturations. Please explain to patient. I have placed an order in the chart. Thanks.  Star Age, MD, PhD [facility])

## 2018-05-06 NOTE — Procedures (Signed)
PATIENT'S NAME:  Kenneth Bryant, Kenneth Bryant DOB:      [birth year]      MR#:    [id number]     DATE OF RECORDING: 05/05/2018 REFERRING M.D.:  Assunta Curtis, MD Study Performed:   Baseline Polysomnogram HISTORY: 47 year old man with a history of pancreatic tumor, and reflux disease, who reports a longer standing history of loud snoring. The patient endorsed the Epworth Sleepiness Scale at 9/24 points. The patient's weight 147 pounds with a height of 65 (inches), resulting in a BMI of 24.6 kg/m2. The patient's neck circumference measured 14 inches. CURRENT MEDICATIONS: Calciium carbonate, Revatio.   PROCEDURE:  This is a multichannel digital polysomnogram utilizing the Somnostar 11.2 system.  Electrodes and sensors were applied and monitored per AASM Specifications.   EEG, EOG, Chin and Limb EMG, were sampled at 200 Hz.  ECG, Snore and Nasal Pressure, Thermal Airflow, Respiratory Effort, CPAP Flow and Pressure, Oximetry was sampled at 50 Hz. Digital video and audio were recorded.      BASELINE STUDY  Lights Out was at 22:31 and Lights On at 05:00.  Total recording time (TRT) was 389.5 minutes, with a total sleep time (TST) of 334.5 minutes.   The patient's sleep latency was 6.5 minutes. REM latency was 87.5 minutes, which is normal. The sleep efficiency was 85.9%.     SLEEP ARCHITECTURE: WASO (Wake after sleep onset) was 48 minutes with moderate sleep fragmentation noted. There were 10 minutes in Stage N1, 197 minutes Stage N2, 79 minutes Stage N3 and 48.5 minutes in Stage REM.  The percentage of Stage N1 was 3.%, Stage N2 was 58.9%, Stage N3 was 23.6% and Stage R (REM sleep) was 14.5%, which is mildly reduced. The arousals were noted as: 51 were spontaneous, 3 were associated with PLMs, 25 were associated with respiratory events.  RESPIRATORY ANALYSIS:  There were a total of 75 respiratory events:  8 obstructive apneas, 2 central apneas and 0 mixed apneas with a total of 10 apneas and an apnea index (AI) of 1.8  /hour. There were 65 hypopneas with a hypopnea index of 11.7 /hour. The patient also had 0 respiratory event related arousals (RERAs).      The total APNEA/HYPOPNEA INDEX (AHI) was 13.5/hour and the total RESPIRATORY DISTURBANCE INDEX was 0. 13.5 /hour.  15 events occurred in REM sleep and 103 events in NREM. The REM AHI was 18.6/hour, versus a non-REM AHI of 12.6. The patient spent 197 minutes of total sleep time in the supine position and 138 minutes in non-supine.. The supine AHI was 20.4 versus a non-supine AHI of 3.5.  OXYGEN SATURATION & C02:  The Wake baseline 02 saturation was 96%, with the lowest being 85%. Time spent below 89% saturation equaled 1 minutes.   PERIODIC LIMB MOVEMENTS: The patient had a total of 6 Periodic Limb Movements.  The Periodic Limb Movement (PLM) index was 1.1 and the PLM Arousal index was .5/hour. Audio and video analysis did not show any abnormal or unusual movements, behaviors, phonations or vocalizations. The patient took 1 bathroom break. Mild to moderate snoring was noted. The EKG was in keeping with normal sinus rhythm (NSR).  Post-study, the patient indicated that sleep was the same as usual.   IMPRESSION:  1. Obstructive Sleep Apnea (OSA) 2. Dysfunctions associated with sleep stages or arousal from sleep  RECOMMENDATIONS:  1. This study demonstrates overall mild obstructive sleep apnea, moderate in supine and REM sleep with a total AHI of 13.5/hour, REM AHI of 18.6/hour, supine AHI  of 20.4/hour, and O2 nadir of 85%. Given the patient's medical history and sleep related complaints, treatment with positive airway pressure is recommended. A full-night CPAP titration study is recommended to optimize therapy. Other treatment options may include avoidance of supine sleep position along with weight loss, upper airway or jaw surgery in selected patients or the use of an oral appliance in certain patients. ENT evaluation and/or consultation with a maxillofacial  surgeon or dentist may be feasible in some instances. Please note that untreated obstructive sleep apnea may carry additional perioperative morbidity. Patients with significant obstructive sleep apnea (typically, in the moderate to severe degree) should receive, if possible, perioperative PAP (positive airway pressure) therapy and the surgeons and particularly the anesthesiologists should be informed of the diagnosis and the severity of the sleep disordered breathing. If feasible, the patient may be asked to bring his/her CPAP or BiPAP machine for a planned/elective surgery.  2. This study shows sleep fragmentation and abnormal sleep stage percentages; these are nonspecific findings and per se do not signify an intrinsic sleep disorder or a cause for the patient's sleep-related symptoms. Causes include (but are not limited to) the first night effect of the sleep study, circadian rhythm disturbances, medication effect or an underlying mood disorder or medical problem.  3. The patient should be cautioned not to drive, work at heights, or operate dangerous or heavy equipment when tired or sleepy. Review and reiteration of good sleep hygiene measures should be pursued with any patient. 4. The patient will be seen in follow-up in the sleep clinic at [facility] for discussion of the test results, symptom and treatment compliance review, further management strategies, etc. The referring provider will be notified of the test results.  I certify that I have reviewed the entire raw data recording prior to the issuance of this report in accordance with the Standards of Accreditation of the American Academy of Sleep Medicine (AASM)   Star Age, MD, PhD Diplomat, American Board of Neurology and Sleep Medicine (Neurology and Sleep Medicine)

## 2018-05-06 NOTE — Telephone Encounter (Signed)
I called pt. I advised pt that Dr. Rexene Alberts reviewed their sleep study results and found that pt has mild to moderate osa with an O2 nadir of 85% and recommends that pt be treated with a cpap. Dr. Rexene Alberts recommends that pt return for a repeat sleep study in order to properly titrate the cpap and ensure a good mask fit. Pt is agreeable to returning for a titration study. I advised pt that our sleep lab will file with pt's insurance and call pt to schedule the sleep study when we hear back from the pt's insurance regarding coverage of this sleep study. Pt verbalized understanding of results. Pt had no questions at this time but was encouraged to call back if questions arise.

## 2018-05-06 NOTE — Telephone Encounter (Signed)
I called pt to discuss his sleep study results, no answer, left a message asking him to call me back.  

## 2018-05-06 NOTE — Progress Notes (Signed)
Patient referred by Dr. Brigitte Pulse, seen by me on 04/14/18, diagnostic PSG on 05/05/18.   Please call and notify the patient that the recent sleep study showed mild obstructive sleep apnea, moderate in supine and REM sleep with a total AHI of 13.5/hour, REM AHI of 18.6/hour, supine AHI of 20.4/hour, and O2 nadir of 85%. Given the patient's medical history and sleep related complaints, treatment with positive airway pressure is recommended. A full-night CPAP titration study is recommended to optimize therapy, ie for proper titration and mask fitting and correct monitoring of the oxygen saturations. Please explain to patient. I have placed an order in the chart. Thanks.  Star Age, MD, PhD [facility])

## 2018-05-21 ENCOUNTER — Ambulatory Visit (INDEPENDENT_AMBULATORY_CARE_PROVIDER_SITE_OTHER): Payer: BLUE CROSS/BLUE SHIELD | Admitting: Neurology

## 2018-05-21 DIAGNOSIS — G2581 Restless legs syndrome: Secondary | ICD-10-CM

## 2018-05-21 DIAGNOSIS — G4733 Obstructive sleep apnea (adult) (pediatric): Secondary | ICD-10-CM | POA: Diagnosis not present

## 2018-05-21 DIAGNOSIS — G4720 Circadian rhythm sleep disorder, unspecified type: Secondary | ICD-10-CM

## 2018-05-21 DIAGNOSIS — R351 Nocturia: Secondary | ICD-10-CM

## 2018-05-21 DIAGNOSIS — R0981 Nasal congestion: Secondary | ICD-10-CM

## 2018-05-21 DIAGNOSIS — G4719 Other hypersomnia: Secondary | ICD-10-CM

## 2018-05-22 NOTE — Progress Notes (Signed)
Patient referred by Dr. Brigitte Pulse, seen by me on 04/14/18, diagnostic PSG on 05/05/18.  Patient had a CPAP titration study on 05/21/18.  Please call and inform patient that I have entered an order for treatment with positive airway pressure (PAP) treatment for obstructive sleep apnea (OSA). He did well during the latest sleep study with CPAP. We will, therefore, arrange for a machine for home use through a DME (durable medical equipment) company of His choice; and I will see the patient back in follow-up in about 10 weeks. Please also explain to the patient that I will be looking out for compliance data, which can be downloaded from the machine (stored on an SD card, that is inserted in the machine) or via remote access through a modem, that is built into the machine. At the time of the followup appointment we will discuss sleep study results and how it is going with PAP treatment at home. Please advise patient to bring His machine at the time of the first FU visit, even though this is cumbersome. Bringing the machine for every visit after that will likely not be needed, but often helps for the first visit to troubleshoot if needed. Please re-enforce the importance of compliance with treatment and the need for [place] to monitor compliance data - often an insurance requirement and actually good feedback for the patient as far as how they are doing.  Also remind patient, that any interim PAP machine or mask issues should be first addressed with the DME company, as they can often help better with technical and mask fit issues. Please ask if patient has a preference regarding DME company.  Please also make sure, the patient has a follow-up appointment with me in about 10 weeks from the setup date, thanks. May see one of our nurse practitioners if needed for proper timing of the FU appointment.  Please fax or rout report to the referring provider. Thanks,   Star Age, MD, PhD [facility])

## 2018-05-22 NOTE — Procedures (Signed)
S PATIENT'S NAME:  Kenneth Bryant, Kenneth Bryant DOB:      10-25-1970      MR#:    [id number]     DATE OF RECORDING: 05/21/2018 REFERRING M.D.:  Assunta Curtis, MD Study Performed:   CPAP  Titration HISTORY:  47 year old man with a history of pancreatic tumor, and reflux disease, who returns for a full night PAP titration study. His baseline study on 05/05/18 showed a total AHI of 13.5/hour, REM AHI of 18.6/hour, supine AHI of 20.4/hour, and O2 nadir of 85%. The patient endorsed the Epworth Sleepiness Scale at 9 points and the Fatigue Score at 25 points. The patient's weight 147 pounds with a height of 65 (inches), resulting in a BMI of 24.6 kg/m2. The patient's neck circumference measured 14 inches.  CURRENT MEDICATIONS: Calcium carbonate, Revatio.  PROCEDURE:  This is a multichannel digital polysomnogram utilizing the SomnoStar 11.2 system.  Electrodes and sensors were applied and monitored per AASM Specifications.   EEG, EOG, Chin and Limb EMG, were sampled at 200 Hz.  ECG, Snore and Nasal Pressure, Thermal Airflow, Respiratory Effort, CPAP Flow and Pressure, Oximetry was sampled at 50 Hz. Digital video and audio were recorded.      The patient was fitted with a small Simplus FFM (after mouth venting was noted on the initial, nasal Eson 2 mask). CPAP was initiated at 5 cmH20 with heated humidity per AASM standards and pressure was advanced to 7cmH20 because of hypopneas, apneas and desaturations. At a PAP pressure of 7 cmH20, there was a reduction of the AHI to 0/hour, with supine REM sleep achieved and O2 nadir of 94%.   Lights Out was at 21:53 and Lights On at 05:14. Total recording time (TRT) was 441 minutes, with a total sleep time (TST) of 313.5 minutes. The patient's sleep latency was 21.5 minutes. REM latency was 184 minutes, which is delayed. The sleep efficiency was 71.1%, which is mildly reduced.    SLEEP ARCHITECTURE: WASO (Wake after sleep onset)  was 107.5 minutes with mild to moderate sleep  fragmentation noted. There were 16 minutes in Stage N1, 129.5 minutes Stage N2, 89 minutes Stage N3 and 79 minutes in Stage REM.  The percentage of Stage N1 was 5.1%, Stage N2 was 41.3%, which is mildly decreased, Stage N3 was 28.4% which is increased, and Stage R (REM sleep) was 25.2%, which is high normal. The arousals were noted as: 43 were spontaneous, 3 were associated with PLMs, 2 were associated with respiratory events.  RESPIRATORY ANALYSIS:  There was a total of 3 respiratory events: 1 obstructive apneas, 1 central apneas and 0 mixed apneas with a total of 2 apneas and an apnea index (AI) of .4 /hour. There were 1 hypopneas with a hypopnea index of .2/hour. The patient also had 0 respiratory event related arousals (RERAs).      The total APNEA/HYPOPNEA INDEX  (AHI) was .6/hour and the total RESPIRATORY DISTURBANCE INDEX was .6 /hour  0 events occurred in REM sleep and 3 events in NREM. The REM AHI was 0 /hour versus a non-REM AHI of .8 /hour.  The patient spent 152 minutes of total sleep time in the supine position and 162 minutes in non-supine. The supine AHI was 1.2/hour, versus a non-supine AHI of 0.0.  OXYGEN SATURATION & C02:  The baseline 02 saturation was 96%, with the lowest being 85%. Time spent below 89% saturation equaled 0 minutes.  PERIODIC LIMB MOVEMENTS:  The patient had a total of 28 Periodic Limb Movements. The  Periodic Limb Movement (PLM) index was 5.4 and the PLM Arousal index was .6 /hour.   Audio and video analysis did not show any abnormal or unusual movements, behaviors, phonations or vocalizations. The patient took 2 bathroom breaks. The EKG was in keeping with normal sinus rhythm (NSR).  Post-study, the patient indicated that sleep was worse than usual.   IMPRESSION:   1. Obstructive Sleep Apnea (OSA) 2. Dysfunctions associated with sleep stages or arousal from sleep   RECOMMENDATIONS:   1. This study demonstrates resolution of the patient's obstructive sleep  apnea with CPAP therapy. I will, therefore, start the patient on home CPAP treatment at a pressure of 7 cm via small FFM with heated humidity. The patient should be reminded to be fully compliant with PAP therapy to improve sleep related symptoms and decrease long term cardiovascular risks. The patient should be reminded, that it may take up to 3 months to get fully used to using PAP with all planned sleep. The earlier full compliance is achieved, the better long term compliance tends to be. Please note that untreated obstructive sleep apnea may carry additional perioperative morbidity. Patients with significant obstructive sleep apnea should receive perioperative PAP therapy and the surgeons and particularly the anesthesiologist should be informed of the diagnosis and the severity of the sleep disordered breathing. 2. This study shows sleep fragmentation and mildly abnormal sleep stage percentages; these are nonspecific findings and per se do not signify an intrinsic sleep disorder or a cause for the patient's sleep-related symptoms. Causes include (but are not limited to) the first night effect of the sleep study, circadian rhythm disturbances, medication effect or an underlying mood disorder or medical problem.  3. The patient should be cautioned not to drive, work at heights, or operate dangerous or heavy equipment when tired or sleepy. Review and reiteration of good sleep hygiene measures should be pursued with any patient. 4. The patient will be seen in follow-up in the sleep clinic at [facility] for discussion of the test results, symptom and treatment compliance review, further management strategies, etc. The referring provider will be notified of the test results.   I certify that I have reviewed the entire raw data recording prior to the issuance of this report in accordance with the Standards of Accreditation of the American Academy of Sleep Medicine (AASM)     Star Age, MD, PhD Diplomat, American  Board of Neurology and Sleep Medicine (Neurology and Sleep Medicine)

## 2018-05-22 NOTE — Addendum Note (Signed)
Addended by: Star Age on: 05/22/2018 11:34 AM   Modules accepted: Orders

## 2018-05-25 ENCOUNTER — Telehealth: Payer: Self-pay

## 2018-05-25 NOTE — Telephone Encounter (Signed)
-----   Message from Star Age, MD sent at 05/22/2018 11:33 AM EDT ----- Patient referred by Dr. Brigitte Pulse, seen by me on 04/14/18, diagnostic PSG on 05/05/18.  Patient had a CPAP titration study on 05/21/18.  Please call and inform patient that I have entered an order for treatment with positive airway pressure (PAP) treatment for obstructive sleep apnea (OSA). He did well during the latest sleep study with CPAP. We will, therefore, arrange for a machine for home use through a DME (durable medical equipment) company of His choice; and I will see the patient back in follow-up in about 10 weeks. Please also explain to the patient that I will be looking out for compliance data, which can be downloaded from the machine (stored on an SD card, that is inserted in the machine) or via remote access through a modem, that is built into the machine. At the time of the followup appointment we will discuss sleep study results and how it is going with PAP treatment at home. Please advise patient to bring His machine at the time of the first FU visit, even though this is cumbersome. Bringing the machine for every visit after that will likely not be needed, but often helps for the first visit to troubleshoot if needed. Please re-enforce the importance of compliance with treatment and the need for [place] to monitor compliance data - often an insurance requirement and actually good feedback for the patient as far as how they are doing.  Also remind patient, that any interim PAP machine or mask issues should be first addressed with the DME company, as they can often help better with technical and mask fit issues. Please ask if patient has a preference regarding DME company.  Please also make sure, the patient has a follow-up appointment with me in about 10 weeks from the setup date, thanks. May see one of our nurse practitioners if needed for proper timing of the FU appointment.  Please fax or rout report to the referring provider. Thanks,    Star Age, MD, PhD [facility])

## 2018-05-25 NOTE — Telephone Encounter (Signed)
I called pt to discuss his sleep study results. No answer, left a message asking him to call me back. 

## 2018-05-26 NOTE — Telephone Encounter (Signed)
I called pt. I advised pt that Dr. Rexene Alberts reviewed their sleep study results and found that pt pt did well with the cpap during his latest sleep study. Dr. Rexene Alberts recommends that pt start a cpap at home. I reviewed PAP compliance expectations with the pt. Pt is agreeable to starting a CPAP. I advised pt that an order will be sent to a DME, [facility], and [facility] will call the pt within about one week after they file with the pt's insurance. [facility] will show the pt how to use the machine, fit for masks, and troubleshoot the CPAP if needed. A follow up appt was made for insurance purposes with Dr. Rexene Alberts on 08/25/18 at 8:30am. Pt verbalized understanding to arrive 15 minutes early and bring their CPAP. A letter with all of this information in it will be mailed to the pt as a reminder. I verified with the pt that the address we have on file is correct. Pt verbalized understanding of results. Pt had no questions at this time but was encouraged to call back if questions arise.

## 2018-06-16 DIAGNOSIS — G4733 Obstructive sleep apnea (adult) (pediatric): Secondary | ICD-10-CM | POA: Diagnosis not present

## 2018-07-16 DIAGNOSIS — G4733 Obstructive sleep apnea (adult) (pediatric): Secondary | ICD-10-CM | POA: Diagnosis not present

## 2018-08-16 DIAGNOSIS — G4733 Obstructive sleep apnea (adult) (pediatric): Secondary | ICD-10-CM | POA: Diagnosis not present

## 2018-08-23 ENCOUNTER — Encounter: Payer: Self-pay | Admitting: Neurology

## 2018-08-25 ENCOUNTER — Encounter: Payer: Self-pay | Admitting: Neurology

## 2018-08-25 ENCOUNTER — Ambulatory Visit: Payer: BLUE CROSS/BLUE SHIELD | Admitting: Neurology

## 2018-08-25 VITALS — BP 109/64 | HR 75 | Ht 65.5 in | Wt 151.0 lb

## 2018-08-25 DIAGNOSIS — G4733 Obstructive sleep apnea (adult) (pediatric): Secondary | ICD-10-CM | POA: Diagnosis not present

## 2018-08-25 DIAGNOSIS — Z9989 Dependence on other enabling machines and devices: Secondary | ICD-10-CM

## 2018-08-25 NOTE — Patient Instructions (Signed)
The address for [facility] is: [address] [phone].   Please continue using your CPAP regularly. While your insurance requires that you use CPAP at least 4 hours each night on 70% of the nights, I recommend, that you not skip any nights and use it throughout the night if you can. Getting used to CPAP and staying with the treatment long term does take time and patience and discipline. Untreated obstructive sleep apnea when it is moderate to severe can have an adverse impact on cardiovascular health and raise her risk for heart disease, arrhythmias, hypertension, congestive heart failure, stroke and diabetes. Untreated obstructive sleep apnea causes sleep disruption, nonrestorative sleep, and sleep deprivation. This can have an impact on your day to day functioning and cause daytime sleepiness and impairment of cognitive function, memory loss, mood disturbance, and problems focussing. Using CPAP regularly can improve these symptoms.  Keep up the good work! We can see you in 1 year, you can see one of our nurse practitioners as you are stable. I will see you after that.   You may want to try another type of full facemask, talk to [facility] about it.

## 2018-08-25 NOTE — Progress Notes (Signed)
Order for mask and supplies sent to [facility] via community message. Confirmation received that the order transmitted was successful.

## 2018-08-25 NOTE — Progress Notes (Signed)
Subjective:    Patient ID: Kenneth Bryant is a 47 y.o. male.  HPI     Interim history:   Kenneth Bryant is a 47 year old right-handed gentleman with an underlying medical history of pancreatic tumor (followed by [facility] oncology), and reflux disease, who presents for follow-up consultation of his obstructive sleep apnea, after recent sleep studies and starting CPAP therapy. The patient is unaccompanied today. I first met him on 04/14/2018 at the request of his primary care physician, at which time the patient reported a long history of loud snoring. He was advised to proceed with a sleep study. He had a baseline sleep study, followed by a CPAP titration study. I went over his test results with him in detail today. Baseline sleep study from 05/05/2018 showed a sleep latency of 6.5 minutes, REM latency normal at 87.5 minutes, sleep efficiency 85.9%. He had a mildly increased percentage of stage II sleep, and a mildly reduced percentage of REM sleep, total AHI 13.5 per hour, REM AHI in the moderate range at 18.6 per hour in supine AHI also in the moderate range at 20.4 per hour. Average oxygen saturation was 96%, nadir was 85%. He had no significant PLMS. Based on his medical history and sleep related complaints he was advised to proceed with a CPAP titration study. He had this on 05/21/2018. Sleep efficiency was mildly reduced at 71.1%, sleep latency 21.5 minutes and REM latency delayed at 184 minutes. He had fairly good sleep architecture. He was fitted with a full face mask and CPAP was titrated from 5 cm to 7 cm. On the final pressure his AHI was 0 per hour with supine REM sleep achieved an O2 nadir of 94%. He had no significant PLMS during the study. Based on his test results I prescribed CPAP therapy for home use a pressure of 7 cm. Today, 08/25/2018: I reviewed his CPAP compliance data from 07/25/2014 through 08/23/2018 which is a total of 30 days, during which time he used his CPAP 28 days with percent  used days greater than 4 hours at 83%, indicating very good compliance with an average usage of 4 hours and 46 minutes, residual AHI at goal at 2 per hour, leak on the low side with the 95th percentile at 1.7 L/m on a pressure of 7 cm. He reports CPAP is going well, he does feel he is benefiting from treatment, certainly his snoring is much improved and his wife is pleased about it. He is still struggling to keep the mask on all night, sometimes he takes it off in the middle of the night without realizing it. He is motivated to continue with treatment. He does notice mouth breathing. He would be open to trying a different type of full face mask if possible. He is using the Simplus.   The patient's allergies, current medications, family history, past medical history, past social history, past surgical history and problem list were reviewed and updated as appropriate.   Previously:  04/14/2018: (He) reports a longer standing history of loud snoring. Snoring is bothersome to his wife, he is not sure about any breathing difficulty at night but does wake up with dry mouth and has evidence of mouth breathing. For his snoring he consulted with ENT last year in June and saw Dr. Janace Hoard who talked to him about potential septoplasty and inferior turbinate reduction. He has tried over-the-counter nasal strips and head splint for snoring, to no avail. He is not aware of any family history of OSA. He  does have nasal congestion at night but denies any allergy symptoms during the day or postnasal drip. He denies morning headaches but does have nocturia about once or twice per average night and the occasional restless leg symptoms specially if he plays tennis later in the afternoon or evening. His bedtime is generally between 10:30 and 11 and rise time around 6 or 6:30. He lives with his wife, has 3 children, oldest one in college, the other ones at home, ages 52 and 45, he does have a TV in the bedroom but turned off prior to  falling asleep. They have no pets. He is a nonsmoker and consumes alcohol in the form of wine, 2-3 times a week, caffeine in the form of coffee, 2 cups per day on average in the mornings, he is an Forensic psychologist. Weight has been stable. His Epworth sleepiness score is 9 out of 24 which is borderline, fatigue score is 25 out of 63.  His Past Medical History Is Significant For: Past Medical History:  Diagnosis Date  . Carotid bruit   . Primary malignant neuroendocrine tumor of pancreas [facility])     His Past Surgical History Is Significant For: Past Surgical History:  Procedure Laterality Date  . SPLENECTOMY      His Family History Is Significant For: Family History  Problem Relation Age of Onset  . Benign prostatic hyperplasia Father     His Social History Is Significant For: Social History   Socioeconomic History  . Marital status: Married    Spouse name: Not on file  . Number of children: Not on file  . Years of education: Not on file  . Highest education level: Not on file  Occupational History  . Not on file  Social Needs  . Financial resource strain: Not on file  . Food insecurity:    Worry: Not on file    Inability: Not on file  . Transportation needs:    Medical: Not on file    Non-medical: Not on file  Tobacco Use  . Smoking status: Never Smoker  . Smokeless tobacco: Never Used  Substance and Sexual Activity  . Alcohol use: Yes    Alcohol/week: 3.0 standard drinks    Types: 1 Glasses of wine, 1 Cans of beer, 1 Shots of liquor per week    Comment: occ  . Drug use: Yes  . Sexual activity: Yes  Lifestyle  . Physical activity:    Days per week: Not on file    Minutes per session: Not on file  . Stress: Not on file  Relationships  . Social connections:    Talks on phone: Not on file    Gets together: Not on file    Attends religious service: Not on file    Active member of club or organization: Not on file    Attends meetings of clubs or organizations: Not on file     Relationship status: Not on file  Other Topics Concern  . Not on file  Social History Narrative  . Not on file    His Allergies Are:  No Known Allergies:   His Current Medications Are:  Outpatient Encounter Medications as of 08/25/2018  Medication Sig  . Calcium Carbonate Antacid (ALKA-SELTZER ANTACID PO) Take by mouth.    . sildenafil (REVATIO) 20 MG tablet Take 20 mg by mouth.   No facility-administered encounter medications on file as of 08/25/2018.   :  Review of Systems:  Out of a complete 14 point review  of systems, all are reviewed and negative with the exception of these symptoms as listed below: Review of Systems  Neurological:       Pt presents today to discuss his cpap. Pt reports that his cpap is going well.    Objective:  Neurological Exam  Physical Exam Physical Examination:   Vitals:   08/25/18 0827  BP: 109/64  Pulse: 75    General Examination: The patient is a very pleasant 47 y.o. male in no acute distress. He appears well-developed and well-nourished and well groomed.   HEENT: Normocephalic, atraumatic, pupils are equal, round and reactive to light and accommodation. Corrective eyeglasses in place. Extraocular tracking is good without limitation to gaze excursion or nystagmus noted. Normal smooth pursuit is noted. Hearing is grossly intact. Face is symmetric with normal facial animation and normal facial sensation. Speech is clear with no dysarthria noted. There is no hypophonia. There is no lip, neck/head, jaw or voice tremor. Neck is with FROM. Oropharynx exam reveals: mild mouth dryness, adequate dental hygiene and moderate airway crowding, no obvious changes, mild redness, paranasally.   Chest: Clear to auscultation without wheezing, rhonchi or crackles noted.  Heart: S1+S2+0, regular and normal without murmurs, rubs or gallops noted.   Abdomen: Soft, non-tender and non-distended.  Extremities: There is no obvious change.    Skin: Warm  and dry without trophic changes noted.  Musculoskeletal: exam reveals no obvious joint deformities, tenderness or joint swelling or erythema.   Neurologically:  Mental status: The patient is awake, alert and oriented in all 4 spheres. His immediate and remote memory, attention, language skills and fund of knowledge are appropriate. There is no evidence of aphasia, agnosia, apraxia or anomia. Speech is clear with normal prosody and enunciation. Thought process is linear. Mood is normal and affect is normal.  Cranial nerves II - XII are as described above under HEENT exam. In addition: shoulder shrug is normal with equal shoulder height noted. Motor exam: Normal bulk, strength and tone is noted. There is no tremor. Fine motor skills and coordination: grossly intact.  Cerebellar testing: No dysmetria or intention tremor. There is no truncal or gait ataxia.  Sensory exam: intact to light touch.  Gait, station and balance: He stands easily. No veering to one side is noted. No leaning to one side is noted. Posture is age-appropriate and stance is narrow based. Gait shows normal stride length and normal pace. No problems turning are noted.   Assessment and Plan:  In summary, Janos Shampine is a very pleasant 47 year old male with an underlying medical history of pancreatic tumor ([facility] oncology), and reflux disease, who presents for follow-up consultation of his history of loud snoring with recent sleep study testing in July confirming mild obstructive sleep apnea, moderate during supine and during REM sleep. He did well on CPAP therapy during his titration study in July 2019. He has since then established treatment at home with CPAP. He is compliant with treatment and commended for this. He is still struggling with keeping the mask on all night, he is eligible for new supplies and I would like for him to consider switching to a different brand of full facemask. I placed an order for this. We talked about  his study results in detail today. We also reviewed his compliance data together. He is motivated to continue with treatment and feels he has benefited from it. I suggested a routine follow-up in 1 year, he can see one of our nurse practitioners next time.  He is going to get in touch with his DME company regarding a mask refit as well as renewing his supplies. I answered all his questions today and he was in agreement. I spent 25 minutes in total face-to-face time with the patient, more than 50% of which was spent in counseling and coordination of care, reviewing test results, reviewing medication and discussing or reviewing the diagnosis of OSA, its prognosis and treatment options. Pertinent laboratory and imaging test results that were available during this visit with the patient were reviewed by me and considered in my medical decision making (see chart for details).

## 2018-09-15 DIAGNOSIS — G4733 Obstructive sleep apnea (adult) (pediatric): Secondary | ICD-10-CM | POA: Diagnosis not present

## 2018-10-16 DIAGNOSIS — G4733 Obstructive sleep apnea (adult) (pediatric): Secondary | ICD-10-CM | POA: Diagnosis not present

## 2018-11-16 DIAGNOSIS — G4733 Obstructive sleep apnea (adult) (pediatric): Secondary | ICD-10-CM | POA: Diagnosis not present

## 2018-12-14 DIAGNOSIS — Z923 Personal history of irradiation: Secondary | ICD-10-CM | POA: Diagnosis not present

## 2018-12-14 DIAGNOSIS — D3A8 Other benign neuroendocrine tumors: Secondary | ICD-10-CM | POA: Diagnosis not present

## 2018-12-14 DIAGNOSIS — C787 Secondary malignant neoplasm of liver and intrahepatic bile duct: Secondary | ICD-10-CM | POA: Diagnosis not present

## 2018-12-15 DIAGNOSIS — G4733 Obstructive sleep apnea (adult) (pediatric): Secondary | ICD-10-CM | POA: Diagnosis not present

## 2019-01-15 DIAGNOSIS — G4733 Obstructive sleep apnea (adult) (pediatric): Secondary | ICD-10-CM | POA: Diagnosis not present

## 2019-02-14 DIAGNOSIS — G4733 Obstructive sleep apnea (adult) (pediatric): Secondary | ICD-10-CM | POA: Diagnosis not present

## 2019-02-15 DIAGNOSIS — Z125 Encounter for screening for malignant neoplasm of prostate: Secondary | ICD-10-CM | POA: Diagnosis not present

## 2019-02-15 DIAGNOSIS — Z0000 Encounter for general adult medical examination without abnormal findings: Secondary | ICD-10-CM | POA: Diagnosis not present

## 2019-02-15 DIAGNOSIS — R82998 Other abnormal findings in urine: Secondary | ICD-10-CM | POA: Diagnosis not present

## 2019-02-17 DIAGNOSIS — E7849 Other hyperlipidemia: Secondary | ICD-10-CM | POA: Diagnosis not present

## 2019-02-22 DIAGNOSIS — Z1331 Encounter for screening for depression: Secondary | ICD-10-CM | POA: Diagnosis not present

## 2019-02-22 DIAGNOSIS — C7A098 Malignant carcinoid tumors of other sites: Secondary | ICD-10-CM | POA: Diagnosis not present

## 2019-02-22 DIAGNOSIS — Z0000 Encounter for general adult medical examination without abnormal findings: Secondary | ICD-10-CM | POA: Diagnosis not present

## 2019-02-22 DIAGNOSIS — R351 Nocturia: Secondary | ICD-10-CM | POA: Diagnosis not present

## 2019-02-22 DIAGNOSIS — Z9081 Acquired absence of spleen: Secondary | ICD-10-CM | POA: Diagnosis not present

## 2019-02-22 DIAGNOSIS — E785 Hyperlipidemia, unspecified: Secondary | ICD-10-CM | POA: Diagnosis not present

## 2019-03-17 DIAGNOSIS — G4733 Obstructive sleep apnea (adult) (pediatric): Secondary | ICD-10-CM | POA: Diagnosis not present

## 2019-05-29 ENCOUNTER — Other Ambulatory Visit: Payer: Self-pay

## 2019-05-29 DIAGNOSIS — Z20822 Contact with and (suspected) exposure to covid-19: Secondary | ICD-10-CM

## 2019-05-30 LAB — NOVEL CORONAVIRUS, NAA: SARS-CoV-2, NAA: NOT DETECTED

## 2019-06-22 ENCOUNTER — Encounter: Payer: Self-pay | Admitting: Internal Medicine

## 2019-07-20 ENCOUNTER — Ambulatory Visit (AMBULATORY_SURGERY_CENTER): Payer: Self-pay | Admitting: *Deleted

## 2019-07-20 ENCOUNTER — Other Ambulatory Visit: Payer: Self-pay

## 2019-07-20 VITALS — Temp 97.1°F | Ht 65.5 in | Wt 149.2 lb

## 2019-07-20 DIAGNOSIS — Z1211 Encounter for screening for malignant neoplasm of colon: Secondary | ICD-10-CM

## 2019-07-20 NOTE — Progress Notes (Signed)
No egg or soy allergy known to patient  No issues with past sedation with any surgeries  or procedures, no intubation problems  No diet pills per patient No home 02 use per patient  No blood thinners per patient  Pt denies issues with constipation  No A fib or A flutter  EMMI video sent to pt's e mail   Pt states last week he he hit his coccyx on the well of a boat and has soreness in the area- wanted to make [place] aware   Pt declined COVID rtesting   Due to the COVID-19 pandemic we are asking patients to follow these guidelines. Please only bring one care partner. Please be aware that your care partner may wait in the car in the parking lot or if they feel like they will be too hot to wait in the car, they may wait in the lobby on the 4th floor. All care partners are required to wear a mask the entire time (we do not have any that we can provide them), they need to practice social distancing, and we will do a Covid check for all patient's and care partners when you arrive. Also we will check their temperature and your temperature. If the care partner waits in their car they need to stay in the parking lot the entire time and we will call them on their cell phone when the patient is ready for discharge so they can bring the car to the front of the building. Also all patient's will need to wear a mask into building.

## 2019-07-22 ENCOUNTER — Encounter: Payer: Self-pay | Admitting: Internal Medicine

## 2019-08-02 ENCOUNTER — Telehealth: Payer: Self-pay

## 2019-08-02 NOTE — Telephone Encounter (Signed)
Covid-19 screening questions   Do you now or have you had a fever in the last 14 days?  Do you have any respiratory symptoms of shortness of breath or cough now or in the last 14 days?  Do you have any family members or close contacts with diagnosed or suspected Covid-19 in the past 14 days?  Have you been tested for Covid-19 and found to be positive?       

## 2019-08-02 NOTE — Telephone Encounter (Signed)
Pt responded "no" to all screening questions °

## 2019-08-03 ENCOUNTER — Telehealth: Payer: Self-pay

## 2019-08-03 ENCOUNTER — Other Ambulatory Visit: Payer: Self-pay

## 2019-08-03 ENCOUNTER — Encounter: Payer: Self-pay | Admitting: Internal Medicine

## 2019-08-03 ENCOUNTER — Ambulatory Visit (AMBULATORY_SURGERY_CENTER): Payer: BC Managed Care – PPO | Admitting: Internal Medicine

## 2019-08-03 VITALS — BP 103/54 | HR 75 | Temp 98.6°F | Resp 13 | Ht 65.5 in | Wt 149.2 lb

## 2019-08-03 DIAGNOSIS — K5520 Angiodysplasia of colon without hemorrhage: Secondary | ICD-10-CM

## 2019-08-03 DIAGNOSIS — Z1211 Encounter for screening for malignant neoplasm of colon: Secondary | ICD-10-CM

## 2019-08-03 MED ORDER — SODIUM CHLORIDE 0.9 % IV SOLN: 500.0000 mL | Freq: Once | INTRAVENOUS | Status: DC

## 2019-08-03 NOTE — Telephone Encounter (Signed)
Follow up call attempted.  NALM  

## 2019-08-03 NOTE — Progress Notes (Signed)
Report given to PACU, vss 

## 2019-08-03 NOTE — Patient Instructions (Addendum)
No polyps or cancer seen.  You have a tiny lesion called an AVM - not to worry. Rarely cause significant bleeding.  Could certainly make an occult stool test + so for that reason and that you have had a negative colonoscopy today you do not need to test stool for blood for 10 years.  Next routine colonoscopy or other screening test in 10 years - 2030  I appreciate the opportunity to care for you. Gatha Mayer, MD, FACG  YOU HAD AN ENDOSCOPIC PROCEDURE TODAY AT [facility]:   Refer to the procedure report that was given to you for any specific questions about what was found during the examination.  If the procedure report does not answer your questions, please call your gastroenterologist to clarify.  If you requested that your care partner not be given the details of your procedure findings, then the procedure report has been included in a sealed envelope for you to review at your convenience later.  YOU SHOULD EXPECT: Some feelings of bloating in the abdomen. Passage of more gas than usual.  Walking can help get rid of the air that was put into your GI tract during the procedure and reduce the bloating. If you had a lower endoscopy (such as a colonoscopy or flexible sigmoidoscopy) you may notice spotting of blood in your stool or on the toilet paper. If you underwent a bowel prep for your procedure, you may not have a normal bowel movement for a few days.  Please Note:  You might notice some irritation and congestion in your nose or some drainage.  This is from the oxygen used during your procedure.  There is no need for concern and it should clear up in a day or so.  SYMPTOMS TO REPORT IMMEDIATELY:   Following lower endoscopy (colonoscopy or flexible sigmoidoscopy):  Excessive amounts of blood in the stool  Significant tenderness or worsening of abdominal pains  Swelling of the abdomen that is new, acute  Fever of 100F or higher  For urgent or emergent issues, a  gastroenterologist can be reached at any hour by calling [phone].   DIET:  We do recommend a small meal at first, but then you may proceed to your regular diet.  Drink plenty of fluids but you should avoid alcoholic beverages for 24 hours.  ACTIVITY:  You should plan to take it easy for the rest of today and you should NOT DRIVE or use heavy machinery until tomorrow (because of the sedation medicines used during the test).    FOLLOW UP: Our staff will call the number listed on your records 48-72 hours following your procedure to check on you and address any questions or concerns that you may have regarding the information given to you following your procedure. If we do not reach you, we will leave a message.  We will attempt to reach you two times.  During this call, we will ask if you have developed any symptoms of COVID 19. If you develop any symptoms (ie: fever, flu-like symptoms, shortness of breath, cough etc.) before then, please call [phone].  If you test positive for Covid 19 in the 2 weeks post procedure, please call and report this information to [place].    If any biopsies were taken you will be contacted by phone or by letter within the next 1-3 weeks.  Please call us at [phone] if you have not heard about the biopsies in 3 weeks.    SIGNATURES/CONFIDENTIALITY:  You and/or your care partner have signed paperwork which will be entered into your electronic medical record.  These signatures attest to the fact that that the information above on your After Visit Summary has been reviewed and is understood.  Full responsibility of the confidentiality of this discharge information lies with you and/or your care-partner.

## 2019-08-03 NOTE — Progress Notes (Signed)
Kenneth Bryant   Pt's states no medical or surgical changes since previsit or office visit.

## 2019-08-03 NOTE — Op Note (Signed)
Early Patient Name: Kenneth Bryant Procedure Date: 08/03/2019 8:01 AM MRN: [id number] Endoscopist: Gatha Mayer , MD Age: 48 Referring MD:  Date of Birth: 1971/10/05 Gender: Male Account #: [id number] Procedure:                Colonoscopy Indications:              Screening for colorectal malignant neoplasm Medicines:                Propofol per Anesthesia, Monitored Anesthesia Care Procedure:                Pre-Anesthesia Assessment:                           - Prior to the procedure, a History and Physical                            was performed, and patient medications and                            allergies were reviewed. The patient's tolerance of                            previous anesthesia was also reviewed. The risks                            and benefits of the procedure and the sedation                            options and risks were discussed with the patient.                            All questions were answered, and informed consent                            was obtained. Prior Anticoagulants: The patient has                            taken no previous anticoagulant or antiplatelet                            agents. ASA Grade Assessment: II - A patient with                            mild systemic disease. After reviewing the risks                            and benefits, the patient was deemed in                            satisfactory condition to undergo the procedure.                           After obtaining informed consent, the colonoscope  was passed under direct vision. Throughout the                            procedure, the patient's blood pressure, pulse, and                            oxygen saturations were monitored continuously. The                            Colonoscope was introduced through the anus and                            advanced to the the cecum, identified by   appendiceal orifice and ileocecal valve. The                            colonoscopy was performed without difficulty. The                            patient tolerated the procedure well. The quality                            of the bowel preparation was excellent. The                            ileocecal valve, appendiceal orifice, and rectum                            were photographed. The bowel preparation used was                            Miralax via split dose instruction. Scope In: 8:12:28 AM Scope Out: 8:28:27 AM Scope Withdrawal Time: 0 hours 12 minutes 15 seconds  Total Procedure Duration: 0 hours 15 minutes 59 seconds  Findings:                 The perianal and digital rectal examinations were                            normal. Pertinent negatives include normal prostate                            (size, shape, and consistency).                           A single small angiodysplastic lesion without                            bleeding was found in the transverse colon.                           The exam was otherwise without abnormality on                            direct and retroflexion views. Complications:  No immediate complications. Estimated Blood Loss:     Estimated blood loss: none. Impression:               - A single non-bleeding colonic angiodysplastic                            lesion.                           - The examination was otherwise normal on direct                            and retroflexion views.                           - No specimens collected. Recommendation:           - Patient has a contact number available for                            emergencies. The signs and symptoms of potential                            delayed complications were discussed with the                            patient. Return to normal activities tomorrow.                            Written discharge instructions were provided to the                             patient.                           - Resume previous diet.                           - Continue present medications.                           - Repeat colonoscopy in 10 years for screening                            purposes. Gatha Mayer, MD 08/03/2019 8:41:28 AM This report has been signed electronically.

## 2019-08-05 ENCOUNTER — Telehealth: Payer: Self-pay | Admitting: *Deleted

## 2019-08-05 NOTE — Telephone Encounter (Signed)
1. Have you developed a fever since your procedure? no  2.   Have you had an respiratory symptoms (SOB or cough) since your procedure? no  3.   Have you tested positive for COVID 19 since your procedure no  4.   Have you had any family members/close contacts diagnosed with the COVID 19 since your procedure?  no   If yes to any of these questions please route to Joylene John, RN and Alphonsa Gin, Therapist, sports.     Follow up Call-  Call back number 08/03/2019  Post procedure Call Back phone  # [phone] cell  Permission to leave phone message Yes  Some recent data might be hidden     Patient questions:  Do you have a fever, pain , or abdominal swelling? No. Pain Score  0 *  Have you tolerated food without any problems? Yes.    Have you been able to return to your normal activities? Yes.    Do you have any questions about your discharge instructions: Diet   No. Medications  No. Follow up visit  No.  Do you have questions or concerns about your Care? No.  Actions: * If pain score is 4 or above: No action needed, pain <4.

## 2019-08-26 NOTE — Progress Notes (Addendum)
PATIENT: Kenneth Bryant DOB: 1971-06-09  REASON FOR VISIT: follow up HISTORY FROM: patient  Chief Complaint  Patient presents with   Follow-up    Yearly f/u. Alone. Rm 1. Patient stated that he would like to discuss his compliance.      HISTORY OF PRESENT ILLNESS: Today 08/30/19 Kenneth Bryant is a 48 y.o. male here today for follow up for OSA on CPAP.  He reports difficulty with compliance.  He is a mouth breather.  He is currently using a fullface mask but continues to have difficulty with dry mouth.  He is also having difficulty tolerating the forced air through his mouth.  She is curious if he would do better with a nasal mask.  He continues to have difficulty with 4-hour compliance.  He states that he has used CPAP intermittently over the last few months but not consistently.  Compliance report dated 04/02/2019-05/01/2019 reveals that he used CPAP for about 30 days for compliance of 13%.  He did not use CPAP greater than 4 hours during the 30-day period.  Average usage was 1 hour and 39 minutes.  AHI was 0.3 on 7 cm of water and an EPR of 1.  There was no significant leak.   HISTORY: (copied from Dr Guadelupe Sabin note on 08/25/2018)  Kenneth Bryant is a 48 year old right-handed gentleman with an underlying medical history of pancreatic tumor (followed by [facility] oncology), and reflux disease, who presents for follow-up consultation of his obstructive sleep apnea, after recent sleep studies and starting CPAP therapy. The patient is unaccompanied today. I first met him on 04/14/2018 at the request of his primary care physician, at which time the patient reported a long history of loud snoring. He was advised to proceed with a sleep study. He had a baseline sleep study, followed by a CPAP titration study. I went over his test results with him in detail today. Baseline sleep study from 05/05/2018 showed a sleep latency of 6.5 minutes, REM latency normal at 87.5 minutes, sleep efficiency 85.9%. He had  a mildly increased percentage of stage II sleep, and a mildly reduced percentage of REM sleep, total AHI 13.5 per hour, REM AHI in the moderate range at 18.6 per hour in supine AHI also in the moderate range at 20.4 per hour. Average oxygen saturation was 96%, nadir was 85%. He had no significant PLMS. Based on his medical history and sleep related complaints he was advised to proceed with a CPAP titration study. He had this on 05/21/2018. Sleep efficiency was mildly reduced at 71.1%, sleep latency 21.5 minutes and REM latency delayed at 184 minutes. He had fairly good sleep architecture. He was fitted with a full face mask and CPAP was titrated from 5 cm to 7 cm. On the final pressure his AHI was 0 per hour with supine REM sleep achieved an O2 nadir of 94%. He had no significant PLMS during the study. Based on his test results I prescribed CPAP therapy for home use a pressure of 7 cm.  Today, 08/25/2018: I reviewed his CPAP compliance data from 07/25/2014 through 08/23/2018 which is a total of 30 days, during which time he used his CPAP 28 days with percent used days greater than 4 hours at 83%, indicating very good compliance with an average usage of 4 hours and 46 minutes, residual AHI at goal at 2 per hour, leak on the low side with the 95th percentile at 1.7 L/m on a pressure of 7 cm. He reports CPAP is  going well, he does feel he is benefiting from treatment, certainly his snoring is much improved and his wife is pleased about it. He is still struggling to keep the mask on all night, sometimes he takes it off in the middle of the night without realizing it. He is motivated to continue with treatment. He does notice mouth breathing. He would be open to trying a different type of full face mask if possible. He is using the Simplus.   The patient's allergies, current medications, family history, past medical history, past social history, past surgical history and problem list were reviewed and updated as  appropriate.   Previously:  04/14/2018: (He) reports a longer standing history of loud snoring. Snoring is bothersome to his wife, he is not sure about any breathing difficulty at night but does wake up with dry mouth and has evidence of mouth breathing. For his snoring he consulted with ENT last year in June and saw Dr. Janace Hoard who talked to him about potential septoplasty and inferior turbinate reduction. He has tried over-the-counter nasal strips and head splint for snoring, to no avail. He is not aware of any family history of OSA. He does have nasal congestion at night but denies any allergy symptoms during the day or postnasal drip. He denies morning headaches but does have nocturia about once or twice per average night and the occasional restless leg symptoms specially if he plays tennis later in the afternoon or evening. His bedtime is generally between 10:30 and 11 and rise time around 6 or 6:30. He lives with his wife, has 3 children, oldest one in college, the other ones at home, ages 43 and 10, he does have a TV in the bedroom but turned off prior to falling asleep. They have no pets. He is a nonsmoker and consumes alcohol in the form of wine, 2-3 times a week, caffeine in the form of coffee, 2 cups per day on average in the mornings, he is an Forensic psychologist. Weight has been stable.His Epworth sleepiness score is 9 out of 24 which is borderline, fatigue score is 25 out of 63.   REVIEW OF SYSTEMS: Out of a complete 14 system review of symptoms, the patient complains only of the following symptoms, none and all other reviewed systems are negative.  Epworth sleepiness scale 4 Fatigue severity scale 25  ALLERGIES: No Known Allergies  HOME MEDICATIONS: Outpatient Medications Prior to Visit  Medication Sig Dispense Refill   Calcium Carbonate Antacid (ALKA-SELTZER ANTACID PO) Take by mouth as needed.      sildenafil (REVATIO) 20 MG tablet Take 20 mg by mouth as needed.      No  facility-administered medications prior to visit.     PAST MEDICAL HISTORY: Past Medical History:  Diagnosis Date   Allergy    mild    Angiodysplasia of colon    Carotid bruit    Primary malignant neuroendocrine tumor of pancreas ([place]) 2014   Sleep apnea    mild , no cpap     PAST SURGICAL HISTORY: Past Surgical History:  Procedure Laterality Date   CHOLECYSTECTOMY     LIVER SURGERY     partial removal 2014   PANCREATECTOMY     patial distal    SPLENECTOMY     UPPER GASTROINTESTINAL ENDOSCOPY     WISDOM TOOTH EXTRACTION      FAMILY HISTORY: Family History  Problem Relation Age of Onset   Benign prostatic hyperplasia Father    Colon cancer Neg Hx  Colon polyps Neg Hx    Esophageal cancer Neg Hx    Rectal cancer Neg Hx    Stomach cancer Neg Hx     SOCIAL HISTORY: Social History   Socioeconomic History   Marital status: Married    Spouse name: Not on file   Number of children: Not on file   Years of education: Not on file   Highest education level: Not on file  Occupational History   Not on file  Social Needs   Financial resource strain: Not on file   Food insecurity    Worry: Not on file    Inability: Not on file   Transportation needs    Medical: Not on file    Non-medical: Not on file  Tobacco Use   Smoking status: Never Smoker   Smokeless tobacco: Never Used  Substance and Sexual Activity   Alcohol use: Yes    Alcohol/week: 3.0 standard drinks    Types: 1 Glasses of wine, 1 Cans of beer, 1 Shots of liquor per week    Comment: occ   Drug use: Not Currently   Sexual activity: Yes  Lifestyle   Physical activity    Days per week: Not on file    Minutes per session: Not on file   Stress: Not on file  Relationships   Social connections    Talks on phone: Not on file    Gets together: Not on file    Attends religious service: Not on file    Active member of club or organization: Not on file    Attends meetings  of clubs or organizations: Not on file    Relationship status: Not on file   Intimate partner violence    Fear of current or ex partner: Not on file    Emotionally abused: Not on file    Physically abused: Not on file    Forced sexual activity: Not on file  Other Topics Concern   Not on file  Social History Narrative   Not on file      PHYSICAL EXAM  Vitals:   08/30/19 0724  BP: 104/66  Pulse: (!) 57  Temp: (!) 97.1 F (36.2 C)  TempSrc: Oral  Weight: 147 lb 12.8 oz (67 kg)  Height: 5' 5.5" (1.664 m)   Body mass index is 24.22 kg/m.  Generalized: Well developed, in no acute distress  Cardiology: normal rate and rhythm, no murmur noted Respiratory: Clear to auscultation bilaterally Neurological examination  Mentation: Alert oriented to time, place, history taking. Follows all commands speech and language fluent Cranial nerve II-XII: Pupils were equal round reactive to light. Extraocular movements were full, visual field were full on confrontational test. Facial sensation and strength were normal. Uvula tongue midline. Head turning and shoulder shrug  were normal and symmetric. Motor: The motor testing reveals 5 over 5 strength of all 4 extremities. Good symmetric motor tone is noted throughout.  Sensory: Sensory testing is intact to soft touch on all 4 extremities. No evidence of extinction is noted.  Coordination: Cerebellar testing reveals good finger-nose-finger and heel-to-shin bilaterally.  Gait and station: Gait is normal.   DIAGNOSTIC DATA (LABS, IMAGING, TESTING) - I reviewed patient records, labs, notes, testing and imaging myself where available.  No flowsheet data found.   Lab Results  Component Value Date   WBC 8.0 12/16/2012   HGB 15.0 12/16/2012   HCT 44.9 12/16/2012   MCV 86.7 12/16/2012   PLT 174 12/16/2012  Component Value Date/Time   NA 136 03/19/2007 0907   K 3.9 03/19/2007 0907   CL 103 03/19/2007 0907   GLUCOSE 86 03/19/2007 0907     BUN 11 03/19/2007 0907   CREATININE 1.0 03/19/2007 0910   PROT 7.3 04/11/2008 1129   ALBUMIN 4.2 04/11/2008 1129   AST 27 04/11/2008 1129   ALT 31 04/11/2008 1129   ALKPHOS 72 04/11/2008 1129   BILITOT 0.8 04/11/2008 1129   No results found for: CHOL, HDL, LDLCALC, LDLDIRECT, TRIG, CHOLHDL No results found for: HGBA1C No results found for: VITAMINB12 No results found for: TSH     ASSESSMENT AND PLAN 48 y.o. year old male  has a past medical history of Allergy, Angiodysplasia of colon, Carotid bruit, Primary malignant neuroendocrine tumor of pancreas ([place]) (2014), and Sleep apnea. here with     ICD-10-CM   1. OSA on CPAP  G47.33 For home use only DME continuous positive airway pressure (CPAP)   Z99.89     Mr. Viens continues to have difficulty with nightly compliance, using CPAP greater than 4 hours as well as being uncomfortable with the full facemask.  We have discussed several options.  He would like to try a nasal mask/nasal pillows with a chinstrap.  I have ordered a mask refitting with his DME company.  He is encouraged to continue using CPAP nightly and for greater than 4 hours each night.  We will follow-up in 2 to 3 months for compliance review.  He will call sooner with any concerns.  He verbalizes understanding and agreement with this plan.   Orders Placed This Encounter  Procedures   For home use only DME continuous positive airway pressure (CPAP)    Mask refitting, patient would like to try  nasal mask with chin strap.    Order Specific Question:   Length of Need    Answer:   Lifetime    Order Specific Question:   Patient has OSA or probable OSA    Answer:   Yes    Order Specific Question:   Is the patient currently using CPAP in the home    Answer:   Yes    Order Specific Question:   Settings    Answer:   Other see comments    Order Specific Question:   CPAP supplies needed    Answer:   Mask, headgear, cushions, filters, heated tubing and water chamber      No orders of the defined types were placed in this encounter.     I spent 15 minutes with the patient. 50% of this time was spent counseling and educating patient on plan of care and medications.    Debbora Presto, FNP-C 08/30/2019, 7:48 AM [facility] [address] [place], [place] [address] [phone]  I reviewed the above note and documentation by the Nurse Practitioner and agree with the history, exam, assessment and plan as outlined above. I was available for consultation. Star Age, MD, PhD [facility])

## 2019-08-30 ENCOUNTER — Ambulatory Visit (INDEPENDENT_AMBULATORY_CARE_PROVIDER_SITE_OTHER): Payer: BC Managed Care – PPO | Admitting: Family Medicine

## 2019-08-30 ENCOUNTER — Other Ambulatory Visit: Payer: Self-pay

## 2019-08-30 ENCOUNTER — Encounter: Payer: Self-pay | Admitting: Family Medicine

## 2019-08-30 ENCOUNTER — Ambulatory Visit: Payer: BLUE CROSS/BLUE SHIELD | Admitting: Adult Health

## 2019-08-30 VITALS — BP 104/66 | HR 57 | Temp 97.1°F | Ht 65.5 in | Wt 147.8 lb

## 2019-08-30 DIAGNOSIS — G4733 Obstructive sleep apnea (adult) (pediatric): Secondary | ICD-10-CM | POA: Diagnosis not present

## 2019-08-30 DIAGNOSIS — Z9989 Dependence on other enabling machines and devices: Secondary | ICD-10-CM | POA: Diagnosis not present

## 2019-08-30 NOTE — Patient Instructions (Addendum)
We will send for a mask refitting  Please work on nightly compliance and greater than 4 hours each night  Follow up in 2-3 months   Sleep Apnea Sleep apnea affects breathing during sleep. It causes breathing to stop for a short time or to become shallow. It can also increase the risk of:  Heart attack.  Stroke.  Being very overweight (obese).  Diabetes.  Heart failure.  Irregular heartbeat. The goal of treatment is to help you breathe normally again. What are the causes? There are three kinds of sleep apnea:  Obstructive sleep apnea. This is caused by a blocked or collapsed airway.  Central sleep apnea. This happens when the brain does not send the right signals to the muscles that control breathing.  Mixed sleep apnea. This is a combination of obstructive and central sleep apnea. The most common cause of this condition is a collapsed or blocked airway. This can happen if:  Your throat muscles are too relaxed.  Your tongue and tonsils are too large.  You are overweight.  Your airway is too small. What increases the risk?  Being overweight.  Smoking.  Having a small airway.  Being older.  Being male.  Drinking alcohol.  Taking medicines to calm yourself (sedatives or tranquilizers).  Having family members with the condition. What are the signs or symptoms?  Trouble staying asleep.  Being sleepy or tired during the day.  Getting angry a lot.  Loud snoring.  Headaches in the morning.  Not being able to focus your mind (concentrate).  Forgetting things.  Less interest in sex.  Mood swings.  Personality changes.  Feelings of sadness (depression).  Waking up a lot during the night to pee (urinate).  Dry mouth.  Sore throat. How is this diagnosed?  Your medical history.  A physical exam.  A test that is done when you are sleeping (sleep study). The test is most often done in a sleep lab but may also be done at home. How is this  treated?   Sleeping on your side.  Using a medicine to get rid of mucus in your nose (decongestant).  Avoiding the use of alcohol, medicines to help you relax, or certain pain medicines (narcotics).  Losing weight, if needed.  Changing your diet.  Not smoking.  Using a machine to open your airway while you sleep, such as: ? An oral appliance. This is a mouthpiece that shifts your lower jaw forward. ? A CPAP device. This device blows air through a mask when you breathe out (exhale). ? An EPAP device. This has valves that you put in each nostril. ? A BPAP device. This device blows air through a mask when you breathe in (inhale) and breathe out.  Having surgery if other treatments do not work. It is important to get treatment for sleep apnea. Without treatment, it can lead to:  High blood pressure.  Coronary artery disease.  In men, not being able to have an erection (impotence).  Reduced thinking ability. Follow these instructions at home: Lifestyle  Make changes that your doctor recommends.  Eat a healthy diet.  Lose weight if needed.  Avoid alcohol, medicines to help you relax, and some pain medicines.  Do not use any products that contain nicotine or tobacco, such as cigarettes, e-cigarettes, and chewing tobacco. If you need help quitting, ask your doctor. General instructions  Take over-the-counter and prescription medicines only as told by your doctor.  If you were given a machine to use  while you sleep, use it only as told by your doctor.  If you are having surgery, make sure to tell your doctor you have sleep apnea. You may need to bring your device with you.  Keep all follow-up visits as told by your doctor. This is important. Contact a doctor if:  The machine that you were given to use during sleep bothers you or does not seem to be working.  You do not get better.  You get worse. Get help right away if:  Your chest hurts.  You have trouble  breathing in enough air.  You have an uncomfortable feeling in your back, arms, or stomach.  You have trouble talking.  One side of your body feels weak.  A part of your face is hanging down. These symptoms may be an emergency. Do not wait to see if the symptoms will go away. Get medical help right away. Call your local emergency services (911 in the [place] Do not drive yourself to the hospital. Summary  This condition affects breathing during sleep.  The most common cause is a collapsed or blocked airway.  The goal of treatment is to help you breathe normally while you sleep. This information is not intended to replace advice given to you by your health care provider. Make sure you discuss any questions you have with your health care provider. Document Released: 07/02/2008 Document Revised: 07/10/2018 Document Reviewed: 05/19/2018 Elsevier Patient Education  2020 [employer].

## 2019-08-31 DIAGNOSIS — Z03818 Encounter for observation for suspected exposure to other biological agents ruled out: Secondary | ICD-10-CM | POA: Diagnosis not present

## 2019-09-09 DIAGNOSIS — G4733 Obstructive sleep apnea (adult) (pediatric): Secondary | ICD-10-CM | POA: Diagnosis not present

## 2019-09-10 DIAGNOSIS — G4733 Obstructive sleep apnea (adult) (pediatric): Secondary | ICD-10-CM | POA: Diagnosis not present

## 2019-11-08 ENCOUNTER — Ambulatory Visit: Payer: BC Managed Care – PPO | Attending: Internal Medicine

## 2019-11-08 DIAGNOSIS — Z20822 Contact with and (suspected) exposure to covid-19: Secondary | ICD-10-CM

## 2019-11-09 LAB — NOVEL CORONAVIRUS, NAA: SARS-CoV-2, NAA: NOT DETECTED

## 2019-12-01 ENCOUNTER — Ambulatory Visit: Payer: BC Managed Care – PPO | Admitting: Family Medicine

## 2019-12-13 DIAGNOSIS — R911 Solitary pulmonary nodule: Secondary | ICD-10-CM | POA: Diagnosis not present

## 2019-12-13 DIAGNOSIS — D3A8 Other benign neuroendocrine tumors: Secondary | ICD-10-CM | POA: Diagnosis not present

## 2020-02-21 DIAGNOSIS — Z125 Encounter for screening for malignant neoplasm of prostate: Secondary | ICD-10-CM | POA: Diagnosis not present

## 2020-02-21 DIAGNOSIS — Z0000 Encounter for general adult medical examination without abnormal findings: Secondary | ICD-10-CM | POA: Diagnosis not present

## 2020-02-21 DIAGNOSIS — R351 Nocturia: Secondary | ICD-10-CM | POA: Diagnosis not present

## 2020-02-21 DIAGNOSIS — E7849 Other hyperlipidemia: Secondary | ICD-10-CM | POA: Diagnosis not present

## 2020-02-28 ENCOUNTER — Ambulatory Visit: Payer: BC Managed Care – PPO | Admitting: Family Medicine

## 2020-02-28 DIAGNOSIS — G4733 Obstructive sleep apnea (adult) (pediatric): Secondary | ICD-10-CM | POA: Diagnosis not present

## 2020-02-28 DIAGNOSIS — E785 Hyperlipidemia, unspecified: Secondary | ICD-10-CM | POA: Diagnosis not present

## 2020-02-28 DIAGNOSIS — C7A098 Malignant carcinoid tumors of other sites: Secondary | ICD-10-CM | POA: Diagnosis not present

## 2020-02-28 DIAGNOSIS — Z1331 Encounter for screening for depression: Secondary | ICD-10-CM | POA: Diagnosis not present

## 2020-02-28 DIAGNOSIS — E7849 Other hyperlipidemia: Secondary | ICD-10-CM | POA: Diagnosis not present

## 2020-02-28 DIAGNOSIS — R82998 Other abnormal findings in urine: Secondary | ICD-10-CM | POA: Diagnosis not present

## 2020-02-28 DIAGNOSIS — Z0000 Encounter for general adult medical examination without abnormal findings: Secondary | ICD-10-CM | POA: Diagnosis not present

## 2020-02-28 DIAGNOSIS — R0683 Snoring: Secondary | ICD-10-CM | POA: Diagnosis not present

## 2020-03-01 DIAGNOSIS — Z1212 Encounter for screening for malignant neoplasm of rectum: Secondary | ICD-10-CM | POA: Diagnosis not present

## 2020-06-08 DIAGNOSIS — I8393 Asymptomatic varicose veins of bilateral lower extremities: Secondary | ICD-10-CM | POA: Diagnosis not present

## 2020-06-19 DIAGNOSIS — D3A8 Other benign neuroendocrine tumors: Secondary | ICD-10-CM | POA: Diagnosis not present

## 2020-06-19 DIAGNOSIS — R911 Solitary pulmonary nodule: Secondary | ICD-10-CM | POA: Diagnosis not present

## 2020-06-19 DIAGNOSIS — C7A8 Other malignant neuroendocrine tumors: Secondary | ICD-10-CM | POA: Diagnosis not present

## 2020-06-19 DIAGNOSIS — R918 Other nonspecific abnormal finding of lung field: Secondary | ICD-10-CM | POA: Diagnosis not present

## 2024-04-20 ENCOUNTER — Ambulatory Visit (INDEPENDENT_AMBULATORY_CARE_PROVIDER_SITE_OTHER): Admitting: Family Medicine

## 2024-04-20 ENCOUNTER — Other Ambulatory Visit: Payer: Self-pay

## 2024-04-20 ENCOUNTER — Encounter: Payer: Self-pay | Admitting: Family Medicine

## 2024-04-20 VITALS — BP 98/74 | Ht 66.0 in | Wt 142.0 lb

## 2024-04-20 DIAGNOSIS — M25521 Pain in right elbow: Secondary | ICD-10-CM

## 2024-04-20 DIAGNOSIS — M7711 Lateral epicondylitis, right elbow: Secondary | ICD-10-CM | POA: Diagnosis not present

## 2024-04-20 MED ORDER — NITROGLYCERIN 0.2 MG/HR TD PT24: MEDICATED_PATCH | TRANSDERMAL | 1 refills | Status: AC

## 2024-04-20 NOTE — Patient Instructions (Signed)

## 2024-04-20 NOTE — Progress Notes (Signed)
 DATE OF VISIT: 04/20/2024        Kenneth Bryant DOB: 1970/11/21 MRN: [id number]  CC:  Rt elbow pain  History of present Illness: Kenneth Bryant is a 53 y.o. male who presents for evaluation of right lateral elbow pain Right-hand-dominant tennis player Pain along lateral elbow for 6 weeks Was playing tennis when it started, he is not playing due to pain and trouble holding his racquet Over the last several weeks has noted trouble tennis racquet, also pain when trying to lift a jug of milk Has tried using a copper fit sleeve Has also tried using over-the-counter NSAIDs and home exercise program without improvement Denies any significant swelling or bruising Denies any previous issues with the elbow Is having some associated nighttime pain  Denies any history of migraine headaches Denies any history of adhesive allergy Does use sildenafil as needed, does not take on a regular basis  Medications:  Outpatient Encounter Medications as of 04/20/2024  Medication Sig   nitroGLYCERIN  (NITRODUR - DOSED IN MG/24 HR) 0.2 mg/hr patch Use 1/4 patch daily to the affected area.   Calcium Carbonate Antacid (ALKA-SELTZER ANTACID PO) Take by mouth as needed.    sildenafil (REVATIO) 20 MG tablet Take 20 mg by mouth as needed.    No facility-administered encounter medications on file as of 04/20/2024.    Allergies: has no known allergies.  Physical Examination: Vitals: BP 98/74   Ht 5' 6 (1.676 m)   Wt 142 lb (64.4 kg)   BMI 22.92 kg/m  GENERAL:  Kenneth Bryant is a 53 y.o. male appearing their stated age, alert and oriented x 3, in no apparent distress.  SKIN: no rashes or lesions, skin clean, dry, intact MSK: Elbow: Right elbow without any gross deformity.  Full range of motion pain.  Tender palpation over the lateral epicondyle and the common extensor tendon.  No tenderness over the medial epicondyle, olecranon.  Increased pain with resisted third finger extension and resisted wrist extension.   No pain with resisted wrist flexion.  Normal grip strength. Left elbow with full range of motion without pain, weakness, instability NEURO: sensation intact to light touch, DTR 2/4 bicep, tricep, brachioradialis bilaterally VASC: pulses 2+ and symmetric radial artery bilaterally, no edema  Radiology: Limited MSK ultrasound right elbow Date: 04/20/2024 Indication: Right lateral elbow pain Findings: - Lateral epicondyle with few small calcific densities off the tip of the lateral epicondyle consistent with prior avulsion injuries.  No increased Doppler flow in these areas - Common extensor tendon with partial tearing visible both in long and short axis views.  Tearing just distal to the lateral epicondyle, but also some signs of partial tearing at the origin of the lateral epicondyle.  No significant increased Doppler flow is appreciated  Impression: - Partial tear of common extensor tendon, as well as calcifications noted at the lateral epicondyle consistent with prior avulsion injuries Images and interpretation completed by Kenneth Cedar, DO    Assessment & Plan Lateral epicondylitis of right elbow Right lateral elbow pain x 6 weeks after playing tennis, MSK ultrasound showing partial tear of the common extensor tendon, as well as small avulsion flecks at the lateral epicondyle  Plan: - Diagnosis and treatment discussed.  MSK ultrasound findings reviewed in detail - Discussed treatment options.  Patient is candidate for topical nitroglycerin  therapy.  He denies any history of migraine headaches or adhesive allergy.  He does take sildenafil as needed, but not on a regular basis.  Rx topical nitroglycerin  0.2 mg  apply quarter patch to the right elbow, leave in place 24 hours, change daily.  Side effects reviewed.  Advised should not take with sildenafil.  If plans to be sexually active advise him to remove the patch before using sildenafil and not reapplying until the following day.  Advised most  common side effects of nitroglycerin  patches headache.  If this occurs can use over-the-counter Tylenol  or NSAIDs as needed.  If headache is too intense, should remove the patch and contact the office - He was fitted with a counterforce brace in the office today.  Should wear this with activity - Instructed on home exercise program - He will follow-up with me in 6 weeks for reevaluation, sooner as needed.  Will plan for updated MSK ultrasound at that time to assess degree of healing   Patient expressed understanding & agreement with above.  Encounter Diagnosis  Name Primary?   Lateral epicondylitis of right elbow Yes    Orders Placed This Encounter  Procedures   US  LIMITED JOINT SPACE STRUCTURES UP RIGHT
